# Patient Record
Sex: Female | Born: 1960 | Race: White | Hispanic: No | Smoking: Current every day smoker
Health system: Southern US, Community
[De-identification: ages and names within clinical notes are randomized; demographics above are authoritative.]

## PROBLEM LIST (undated history)

## (undated) DIAGNOSIS — B182 Chronic viral hepatitis C: Secondary | ICD-10-CM

## (undated) DIAGNOSIS — C801 Malignant (primary) neoplasm, unspecified: Secondary | ICD-10-CM

## (undated) DIAGNOSIS — C220 Liver cell carcinoma: Principal | ICD-10-CM

## (undated) DIAGNOSIS — C50919 Malignant neoplasm of unspecified site of unspecified female breast: Secondary | ICD-10-CM

## (undated) HISTORY — DX: Chronic viral hepatitis C: B18.2

## (undated) HISTORY — PX: APPENDECTOMY: SHX54

## (undated) HISTORY — DX: Liver cell carcinoma: C22.0

## (undated) HISTORY — DX: Malignant (primary) neoplasm, unspecified: C80.1

---

## 1998-12-07 ENCOUNTER — Other Ambulatory Visit: Admission: RE | Admit: 1998-12-07 | Discharge: 1998-12-07 | Payer: Self-pay | Admitting: Obstetrics and Gynecology

## 1999-12-16 ENCOUNTER — Other Ambulatory Visit: Admission: RE | Admit: 1999-12-16 | Discharge: 1999-12-16 | Payer: Self-pay | Admitting: Obstetrics and Gynecology

## 2000-04-03 ENCOUNTER — Other Ambulatory Visit: Admission: RE | Admit: 2000-04-03 | Discharge: 2000-04-03 | Payer: Self-pay | Admitting: Obstetrics and Gynecology

## 2000-05-09 ENCOUNTER — Other Ambulatory Visit: Admission: RE | Admit: 2000-05-09 | Discharge: 2000-05-09 | Payer: Self-pay | Admitting: Obstetrics and Gynecology

## 2000-10-04 ENCOUNTER — Other Ambulatory Visit: Admission: RE | Admit: 2000-10-04 | Discharge: 2000-10-04 | Payer: Self-pay | Admitting: Obstetrics and Gynecology

## 2001-09-26 ENCOUNTER — Ambulatory Visit (HOSPITAL_COMMUNITY): Admission: RE | Admit: 2001-09-26 | Discharge: 2001-09-26 | Payer: Self-pay | Admitting: Obstetrics and Gynecology

## 2002-01-17 ENCOUNTER — Ambulatory Visit (HOSPITAL_COMMUNITY): Admission: RE | Admit: 2002-01-17 | Discharge: 2002-01-17 | Payer: Self-pay | Admitting: Obstetrics and Gynecology

## 2002-04-08 ENCOUNTER — Ambulatory Visit (HOSPITAL_COMMUNITY): Admission: RE | Admit: 2002-04-08 | Discharge: 2002-04-08 | Payer: Self-pay | Admitting: Family Medicine

## 2002-04-08 ENCOUNTER — Encounter: Payer: Self-pay | Admitting: Family Medicine

## 2009-09-12 DIAGNOSIS — C50919 Malignant neoplasm of unspecified site of unspecified female breast: Secondary | ICD-10-CM

## 2009-09-12 HISTORY — DX: Malignant neoplasm of unspecified site of unspecified female breast: C50.919

## 2010-03-12 HISTORY — PX: PORT-A-CATH REMOVAL: SHX5289

## 2010-07-26 HISTORY — PX: MASTECTOMY, RADICAL: SHX710

## 2010-07-26 HISTORY — PX: OPEN PARTIAL HEPATECTOMY [83]: SHX5987

## 2011-10-14 HISTORY — PX: PORT-A-CATH REMOVAL: SHX5289

## 2012-08-14 ENCOUNTER — Ambulatory Visit (INDEPENDENT_AMBULATORY_CARE_PROVIDER_SITE_OTHER): Payer: Medicare Other | Admitting: Family Medicine

## 2012-08-14 VITALS — BP 141/87 | HR 102 | Temp 98.0°F | Resp 16 | Ht 63.0 in | Wt 109.2 lb

## 2012-08-14 DIAGNOSIS — C50919 Malignant neoplasm of unspecified site of unspecified female breast: Secondary | ICD-10-CM | POA: Insufficient documentation

## 2012-08-14 DIAGNOSIS — F419 Anxiety disorder, unspecified: Secondary | ICD-10-CM

## 2012-08-14 DIAGNOSIS — M25511 Pain in right shoulder: Secondary | ICD-10-CM

## 2012-08-14 DIAGNOSIS — K7460 Unspecified cirrhosis of liver: Secondary | ICD-10-CM

## 2012-08-14 DIAGNOSIS — G8929 Other chronic pain: Secondary | ICD-10-CM

## 2012-08-14 DIAGNOSIS — F411 Generalized anxiety disorder: Secondary | ICD-10-CM

## 2012-08-14 MED ORDER — ALPRAZOLAM 0.25 MG PO TABS: 0.2500 mg | ORAL_TABLET | Freq: Two times a day (BID) | ORAL | Status: DC | PRN

## 2012-08-14 MED ORDER — OXYCODONE HCL 5 MG PO TABS: 5.0000 mg | ORAL_TABLET | Freq: Three times a day (TID) | ORAL | Status: DC | PRN

## 2012-08-14 NOTE — Progress Notes (Signed)
51 yo woman with h/o cancer who comes in for medication refill.  She lives in [place], but is from [place].  H/O mastectomy Jul 26, 2009 followed by chemo for 1 year. S/P appendectomy Aug, 2012 followed by intensive care because of life support (ruptured appendix) She still has high cancer markers.  She recently had portacath removed.  Patient also has cirrhosis.  Objective:  NAD, seen with twin sister.  Slurred speech  I discussed the dilaudid which I cannot prescribe  Assessment:  Chronically medicated  Plan:

## 2012-11-30 ENCOUNTER — Encounter (HOSPITAL_COMMUNITY): Payer: Self-pay | Admitting: Emergency Medicine

## 2012-11-30 ENCOUNTER — Emergency Department (HOSPITAL_COMMUNITY): Payer: Medicare Other

## 2012-11-30 ENCOUNTER — Emergency Department (HOSPITAL_COMMUNITY)
Admission: EM | Admit: 2012-11-30 | Discharge: 2012-11-30 | Disposition: A | Discharge status: Home / Self Care | Payer: Medicare Other | Attending: Emergency Medicine | Admitting: Emergency Medicine

## 2012-11-30 DIAGNOSIS — R059 Cough, unspecified: Secondary | ICD-10-CM | POA: Insufficient documentation

## 2012-11-30 DIAGNOSIS — F17200 Nicotine dependence, unspecified, uncomplicated: Secondary | ICD-10-CM | POA: Insufficient documentation

## 2012-11-30 DIAGNOSIS — M25559 Pain in unspecified hip: Secondary | ICD-10-CM | POA: Insufficient documentation

## 2012-11-30 DIAGNOSIS — R209 Unspecified disturbances of skin sensation: Secondary | ICD-10-CM | POA: Insufficient documentation

## 2012-11-30 DIAGNOSIS — R05 Cough: Secondary | ICD-10-CM | POA: Insufficient documentation

## 2012-11-30 DIAGNOSIS — Z859 Personal history of malignant neoplasm, unspecified: Secondary | ICD-10-CM | POA: Insufficient documentation

## 2012-11-30 DIAGNOSIS — M25552 Pain in left hip: Secondary | ICD-10-CM

## 2012-11-30 DIAGNOSIS — R110 Nausea: Secondary | ICD-10-CM | POA: Insufficient documentation

## 2012-11-30 DIAGNOSIS — J3489 Other specified disorders of nose and nasal sinuses: Secondary | ICD-10-CM | POA: Insufficient documentation

## 2012-11-30 MED ORDER — OXYCODONE-ACETAMINOPHEN 5-325 MG PO TABS
1.0000 | ORAL_TABLET | Freq: Once | ORAL | Status: DC
  Filled 2012-11-30: qty 1

## 2012-11-30 MED ORDER — OXYCODONE HCL 5 MG PO TABS: 5.0000 mg | ORAL_TABLET | ORAL | Status: DC | PRN

## 2012-11-30 MED ORDER — HYDROMORPHONE HCL PF 1 MG/ML IJ SOLN
1.0000 mg | Freq: Once | INTRAMUSCULAR | Status: AC
  Administered 2012-11-30: 1 mg via INTRAMUSCULAR
  Filled 2012-11-30: qty 1

## 2012-11-30 NOTE — ED Provider Notes (Signed)
Medical screening examination/treatment/procedure(s) were performed by non-physician practitioner and as supervising physician I was immediately available for consultation/collaboration.  Jamie Diaz. Oletta Lamas, MD 11/30/12 [phone]

## 2012-11-30 NOTE — ED Notes (Signed)
Patient transported to X-ray 

## 2012-11-30 NOTE — ED Provider Notes (Signed)
History    This chart was scribed for non-physician practitioner working with [name]. Oletta Lamas, MD by Leone Payor, ED Scribe. This patient was seen in room WTR7/WTR7 and the patient's care was started at 1721.   CSN: [id number]  Arrival date & time 11/30/12  1721   First MD Initiated Contact with Patient 11/30/12 1820      Chief Complaint  Patient presents with  . Leg Pain     The history is provided by the patient. No language interpreter was used.    Jamie Diaz is a 52 y.o. female who presents to the Emergency Department complaining of new, sudden, gradually worsening, left leg pain that begins to left hip area that radiates to left front thigh and lower leg. Pain is intermittent and has been ongoing for 3 days. Pt states the pain is intolerable. She has used pillows to alleviate the pain with no relief. She has associated mild numbness and tingling to the second and third toes. She denies fevers, back pain, loss of control of bowel or bladder, weakness of the leg.  Denies injury.    Pt had flu like symptoms starting at the beginning of the week that have now resolved. She had sore throat, runny nose, cough and congestion.    Pt is a current everyday smoker but denies alcohol use.  Past Medical History  Diagnosis Date  . Cancer     Past Surgical History  Procedure Laterality Date  . Appendectomy    . Brain surgery      Family History  Problem Relation Age of Onset  . Cancer Mother   . Heart disease Father     History  Substance Use Topics  . Smoking status: Current Every Day Smoker  . Smokeless tobacco: Not on file  . Alcohol Use: Not on file    OB History   Grav Para Term Preterm Abortions TAB SAB Ect Mult Living                  Review of Systems  Constitutional: Negative for fever and chills.  HENT: Positive for congestion.   Respiratory: Positive for cough. Negative for shortness of breath.   Cardiovascular: Negative for chest pain.  Gastrointestinal:  Positive for nausea. Negative for abdominal pain.  Genitourinary: Negative for dysuria, urgency, frequency and hematuria.  Musculoskeletal: Negative for back pain.  Neurological: Positive for numbness.    Allergies  Ibuprofen  Home Medications  No current outpatient prescriptions on file.  BP 138/75  Pulse 91  Temp(Src) 97.7 F (36.5 C) (Oral)  SpO2 99%  Physical Exam  Nursing note and vitals reviewed. Constitutional: She appears well-developed and well-nourished. No distress.  HENT:  Head: Normocephalic and atraumatic.  Neck: Neck supple.  Pulmonary/Chest: Effort normal.  Abdominal: Soft. She exhibits no mass. There is no tenderness. There is no rebound and no guarding.  Musculoskeletal: She exhibits no edema.       Left hip: She exhibits normal strength, no swelling, no crepitus and no deformity.       Legs: Spine is non tender, no crepitus, no step offs.   Left hip:  Pain with passive ROM.  Tender posteriorly.  No erythema, edema, warmth.  Distal pulses intact, sensation intact, strength 5/5.    Neurological: She is alert.  Skin: She is not diaphoretic.    ED Course  Procedures (including critical care time)  DIAGNOSTIC STUDIES: Oxygen Saturation is 99% on room air, normal by my interpretation.  COORDINATION OF CARE: 6:31 PM Discussed treatment plan with pt at bedside and pt agreed to plan.    Labs Reviewed - No data to display Dg Hip Complete Left  11/30/2012  *RADIOLOGY REPORT*  Clinical Data: Left hip pain.  LEFT HIP - COMPLETE 2+ VIEW  Comparison: None  Findings: There is no evidence of fracture, subluxation or dislocation. No focal bony lesions are present. Minimal joint space narrowing bilaterally identified. No other significant abnormalities are present.  IMPRESSION: Minimal joint space narrowing without other significant abnormality.   Original Report Authenticated By: Harmon Pier, M.D.    Discussed patient with Dr Oletta Lamas.   1. Left hip pain     MDM   Pt with left hip pain radiating down leg.  No fevers, back pain. Neurovascularly intact.  Hip without erythema, edema, warmth.  No effusion.  Xray shows joint space narrowing.  Pt has full AROM of hip, though with some pain with abduction.  Doubt septic joint.   Pt d/c home with pain medication, PCP follow up.  Discussed all results with patient.  Pt given return precautions.  Pt verbalizes understanding and agrees with plan.        I personally performed the services described in this documentation, which was scribed in my presence. The recorded information has been reviewed and is accurate.   Trixie Dredge, PA-C 11/30/12 2320

## 2012-11-30 NOTE — ED Notes (Signed)
Lt leg pain that begins to hip area that radates to lt thigh to lower leg intermit for 3 days. She states that she felt like she had the flu and thought it was the flu but it is not.

## 2013-02-22 ENCOUNTER — Telehealth: Payer: Self-pay | Admitting: *Deleted

## 2013-02-22 NOTE — Telephone Encounter (Signed)
Had paperwork for this pt in my door when I came in and I saw that she was from [place] of [place] and hx of breast cancer.  Called pt to discuss getting her set up for f/u care w/ a med onc and she said that she has been recommended to see Dr. Cyndie Chime.  I informed her that I do not schedule for him, however, I would take her paperwork to the department that does and either Tiffany or one of her staff members would be back with her to get this appt scheduled.  She was fine w/ this.  Took paperwork to [employer].

## 2013-02-27 NOTE — Telephone Encounter (Signed)
Patient calling in to make sure all records have been received to establish a new patient appt. She has breast cancer and rec'd chemo in [place] where she has been living the past couple years. Currently all treatment is completed. Would like to see Dr Cyndie Chime, due to patient has had several family members under his care. Will forward this information to Tiffany in New Patient Appts.

## 2013-03-13 ENCOUNTER — Telehealth: Payer: Self-pay | Admitting: Oncology

## 2013-03-13 NOTE — Telephone Encounter (Signed)
LEFT PT VM IN REF TO NP APPT ON 04/10/13@1 :30 (PER DR G) REFERRING DR Lenor Derrick [place] MAILED NP PACKET

## 2013-03-13 NOTE — Telephone Encounter (Signed)
C/D 03/13/13 for appt. 04/10/13

## 2013-03-31 ENCOUNTER — Other Ambulatory Visit: Payer: Self-pay | Admitting: Oncology

## 2013-03-31 DIAGNOSIS — C50912 Malignant neoplasm of unspecified site of left female breast: Secondary | ICD-10-CM

## 2013-04-10 ENCOUNTER — Ambulatory Visit: Payer: Medicare Other

## 2013-04-10 ENCOUNTER — Encounter: Payer: Self-pay | Admitting: Oncology

## 2013-04-10 ENCOUNTER — Ambulatory Visit: Payer: Medicare Other | Admitting: Lab

## 2013-04-10 ENCOUNTER — Ambulatory Visit (HOSPITAL_BASED_OUTPATIENT_CLINIC_OR_DEPARTMENT_OTHER): Payer: Medicare Other | Admitting: Oncology

## 2013-04-10 ENCOUNTER — Other Ambulatory Visit (HOSPITAL_BASED_OUTPATIENT_CLINIC_OR_DEPARTMENT_OTHER): Payer: Medicare Other | Admitting: Lab

## 2013-04-10 VITALS — BP 190/105 | HR 114 | Temp 97.8°F | Resp 20 | Ht 64.0 in | Wt 123.1 lb

## 2013-04-10 DIAGNOSIS — C50912 Malignant neoplasm of unspecified site of left female breast: Secondary | ICD-10-CM

## 2013-04-10 DIAGNOSIS — C50919 Malignant neoplasm of unspecified site of unspecified female breast: Secondary | ICD-10-CM

## 2013-04-10 DIAGNOSIS — K7460 Unspecified cirrhosis of liver: Secondary | ICD-10-CM

## 2013-04-10 DIAGNOSIS — B182 Chronic viral hepatitis C: Secondary | ICD-10-CM

## 2013-04-10 DIAGNOSIS — C220 Liver cell carcinoma: Secondary | ICD-10-CM

## 2013-04-10 DIAGNOSIS — C228 Malignant neoplasm of liver, primary, unspecified as to type: Secondary | ICD-10-CM

## 2013-04-10 HISTORY — DX: Chronic viral hepatitis C: B18.2

## 2013-04-10 LAB — CBC WITH DIFFERENTIAL/PLATELET
BASO%: 0.4 % (ref 0.0–2.0)
Basophils Absolute: 0 10*3/uL (ref 0.0–0.1)
HCT: 42.6 % (ref 34.8–46.6)
HGB: 14.7 g/dL (ref 11.6–15.9)
MCHC: 34.5 g/dL (ref 31.5–36.0)
MONO#: 0.3 10*3/uL (ref 0.1–0.9)
NEUT%: 72.3 % (ref 38.4–76.8)
RDW: 14.4 % (ref 11.2–14.5)
WBC: 4.5 10*3/uL (ref 3.9–10.3)
lymph#: 0.8 10*3/uL — ABNORMAL LOW (ref 0.9–3.3)

## 2013-04-10 LAB — COMPREHENSIVE METABOLIC PANEL (CC13)
ALT: 83 U/L — ABNORMAL HIGH (ref 0–55)
AST: 159 U/L — ABNORMAL HIGH (ref 5–34)
Albumin: 3.4 g/dL — ABNORMAL LOW (ref 3.5–5.0)
CO2: 25 mEq/L (ref 22–29)
Calcium: 9.3 mg/dL (ref 8.4–10.4)
Chloride: 102 mEq/L (ref 98–109)
Creatinine: 0.8 mg/dL (ref 0.6–1.1)
Potassium: 4.1 mEq/L (ref 3.5–5.1)
Total Protein: 9.2 g/dL — ABNORMAL HIGH (ref 6.4–8.3)

## 2013-04-10 LAB — URIC ACID (CC13): Uric Acid, Serum: 6.4 mg/dl (ref 2.6–7.4)

## 2013-04-10 LAB — CANCER ANTIGEN 27.29: CA 27.29: 15 U/mL (ref 0–39)

## 2013-04-10 NOTE — Progress Notes (Signed)
New Patient Hematology-Oncology Evaluation   Jamie Diaz [id number] 08/16/1961 52 y.o. 04/10/2013  CC: Dr. Collins Scotland   Reason for referral: Assume care for this lady relocating here from [place] with history of breast cancer and hepatocellular carcinoma.    HPI:  New patient evaluation  for this pleasant 52 year old woman who presented with a large mass in her left breast. Initial biopsy done 01/18/2010 discovered when she was in [place] where her son-in-law was stationed in the [employer]. She went to assist her daughter who had just had an ectopic pregnancy. 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 During her staging evaluation she was found to have a 2.8 cm lesion in the left lobe of her liver. Liver appeared cirrhotic on CT scan with evidence of portal hypertension and splenomegaly. She had a history of heavy alcohol use. The liver mass was initially felt to be a cirrhotic nodule. However she was found to have an elevated alpha-fetoprotein and further imaging show that the mass was hypervascular and more consistent with a primary hepatocellular carcinoma.. Testing around that time revealed that she was hepatitis C positive.  She underwent initial neoadjuvant chemotherapy with 3 cycles of Adriamycin Cytoxan given through 04/21/2010. On July 26, 2010 she underwent a left modified radical mastectomy with a concomitant partial hepatectomy. The liver lesion was confirmed to be a second primary hepatocellular carcinoma. Stage I.  grade 4, size 2.6 x 2 x 1 cm. She received additional adjuvant chemotherapy after surgery with Taxol at a dose of 175 mg per meter squared every 2 weeks for 4 treatments as well as Herceptin 8 mg per kilogram loading dose then 6 mg per kilogram every 3 weeks. Herceptin was continued for one year through November 2012. She had a brief scare when a new mass was found in  her right breast. Further evaluation revealed that this was a retained part of a Port-A-Cath device which was then surgically removed.  In August of 2013 she had a ruptured appendix complicated by respiratory failure requiring transfer to a major hospital in [place] and temporary ventilator support.   PMH: Past Medical History  Diagnosis Date  . Cancer   . Hepatitis C, chronic 04/10/2013    Dx 2011  No history of MI,  hypertension, diabetes,, tuberculosis, kidney stones, thyroid trouble, seizure, stroke, blood clots. Positive history of ulcers,. Dysplastic changes of the uterus on Pap smear?. Chronic mild thrombocytopenia. Insomnia. Panic attacks. Depression. Chronic right shoulder pain.  Past Surgical History  Procedure Laterality Date  . Appendectomy    . Brain surgery?    Endometrial ablation x2 for menorrhagia.  Allergies: Allergies  Allergen Reactions  . 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 Xanax 0.5 mg up to every 4 hours for anxiety and panic attacks.   Social History: Divorced but her ex. husband and one of her sisters accompanies her today. She has one daughter age 79 who is healthy. She worked in the recent past as a Retail banker.  reports that she has been smoking Cigarettes.  She has been smoking about 0.50 packs per day.   She drinks 3-4 beers a day or one bottle of wine daily.. Curiously she is concerned about taking Tylenol since it might damage her liver. No blood transfusions before her history of breast cancer and cirrhosis.  She admits to IV drug use on a number of occasions when she was in college.  Family History: Family History  Problem Relation Age of Onset  . Cancer Mother   . Heart disease Father   Father died of coronary artery disease at age 34. Mother diagnosed with colon cancer at age 54 and died from progression at age 8. Her brother died at  age 32 likely overdosed alcohol and narcotics.  Review of Systems: Constitutional symptoms: Recently increased fatigue HEENT: No sore throat Respiratory: No cough or dyspnea Cardiovascular:  No chest pain or palpitations Gastrointestinal ROS: No abdominal pain or change in bowel habit Genito-Urinary ROS: No vaginal bleeding Hematological and Lymphatic: Musculoskeletal: Chronic right shoulder pain thought to be degenerative. She was not told that she had a rotator cuff tear. She has noted increasing leg cramps recently. Neurologic: No headache or change in vision. No paresthesias. Positive insomnia. Positive panic attacks. Denies any significant headaches. Dermatologic: No rash or ecchymosis Last mammogram done over a year ago. Remaining ROS negative.  Physical Exam: Blood pressure 190/105, pulse 114, temperature 97.8 F (36.6 C), temperature source Oral, resp. rate 20, height 5\' 4"  (1.626 m), weight 123 lb 1.6 oz (55.838 kg). Wt Readings from Last 3 Encounters:  04/10/13 123 lb 1.6 oz (55.838 kg)  08/14/12 109 lb 3.2 oz (49.533 kg)    General appearance: Thin, Caucasian woman HENNT: Pharynx no erythema exudate or mass Lymph nodes: No lymphadenopathy Breasts: Left mastectomy. No chest wall lesions. No right breast masses. Lungs: Clear to auscultation resonant to percussion Heart: Regular rhythm no murmur Vascular: No carotid bruits, and no cyanosis Abdominal: Soft, nontender, she was ticklish and difficult to examine, no gross organomegaly although I suspect splenomegaly and splenomegaly was noted on a previous CT scan during her cancer evaluation. GU: Extremities: No edema, no calf tenderness Neurologic: Mental status intact, PERRLA, optic disc not well visualized, motor strength 5 over 5, reflexes 1+ symmetric, sensation intact to vibration over the fingertips. Cranial nerves grossly normal. Skin: Spider hemangiomas on the bib the area of her chest    Lab Results: Lab Results   Component Value Date   WBC 4.5 04/10/2013   HGB 14.7 04/10/2013   HCT 42.6 04/10/2013   MCV 97.9 04/10/2013   PLT 98* 04/10/2013     Chemistry      Component Value Date/Time   NA 139 04/10/2013 1332   K 4.1 04/10/2013 1332   CO2 25 04/10/2013 1332   BUN 7.8 04/10/2013 1332   CREATININE 0.8 04/10/2013 1332      Component Value Date/Time   CALCIUM 9.3 04/10/2013 1332   ALKPHOS 99 04/10/2013 1332   AST 159* 04/10/2013 1332   ALT 83* 04/10/2013 1332   BILITOT 0.95 04/10/2013 1332       Pathology: See discussion above     Impression and Plan: #1. T2 N0 ER/PR negative node negative HER-2 positive cancer of the left breast treated as outlined above. No evidence for recurrence at this time. I'm going to schedule a routine right mammogram at the breast imaging center.  #2. Stage I, poorly differentiated hepatocellular carcinoma surgically resected November 2011. No check an alpha-fetoprotein. At some point we will need to get followup imaging.  #3. Chronic hepatitis C infection. She has never been treated for this. I will check a viral load and do a genotype study. I will make a referral to gastroenterology for treatment recommendations if appropriate.  #4. Cirrhosis secondary to #3 Upper endoscopy  done 03/09/2010 did not show any esophageal varices.  #5.. Ongoing alcohol and tobacco use  #6. Splenomegaly due to portal hypertension with associated mild thrombocytopenia  #7. Question history abnormal Pap smear  #8. Chronic pain right shoulder She is requesting Dilaudid. She states this is anything that has worked for her. I told her that this was addicting and I would not prescribe it. I gave her a prescription for Ultram 50 mg one to 2 q. 6 hours when necessary #100 one refill.  #9. Chronic anxiety and depression. I gave her a prescription for Xanax 0.5 mg 3 times a day #90 one refill until she get establish with primary care physician. She may benefit from counseling.  Over one  hour reviewing voluminous records from [place]. Over one hour in direct face-to-face contact with patient and family. 40 minutes to do this dictation.      Levert Feinstein, MD 04/10/2013, 6:26 PM

## 2013-04-10 NOTE — Progress Notes (Signed)
Checked in new pt with no financial concerns. °

## 2013-04-15 ENCOUNTER — Other Ambulatory Visit: Payer: Self-pay | Admitting: Oncology

## 2013-04-15 DIAGNOSIS — C220 Liver cell carcinoma: Secondary | ICD-10-CM

## 2013-04-15 LAB — HEPATITIS C RNA QUANTITATIVE: HCV Quantitative Log: 6.46 {Log} — ABNORMAL HIGH (ref <1.18)

## 2013-04-15 LAB — GAMMA GT: GGT: 493 U/L — ABNORMAL HIGH (ref 7–51)

## 2013-04-17 ENCOUNTER — Ambulatory Visit
Admission: RE | Admit: 2013-04-17 | Discharge: 2013-04-17 | Disposition: A | Discharge status: Home / Self Care | Payer: Medicare Other | Source: Ambulatory Visit | Attending: Oncology | Admitting: Oncology

## 2013-04-17 DIAGNOSIS — C220 Liver cell carcinoma: Secondary | ICD-10-CM

## 2013-04-17 DIAGNOSIS — K7460 Unspecified cirrhosis of liver: Secondary | ICD-10-CM

## 2013-04-17 DIAGNOSIS — C50912 Malignant neoplasm of unspecified site of left female breast: Secondary | ICD-10-CM

## 2013-04-17 DIAGNOSIS — B182 Chronic viral hepatitis C: Secondary | ICD-10-CM

## 2013-04-18 ENCOUNTER — Encounter: Payer: Self-pay | Admitting: Oncology

## 2013-04-18 ENCOUNTER — Ambulatory Visit (HOSPITAL_COMMUNITY)
Admission: RE | Admit: 2013-04-18 | Discharge: 2013-04-18 | Disposition: A | Discharge status: Home / Self Care | Payer: Medicare Other | Source: Ambulatory Visit | Attending: Oncology | Admitting: Oncology

## 2013-04-18 ENCOUNTER — Telehealth: Payer: Self-pay | Admitting: *Deleted

## 2013-04-18 ENCOUNTER — Other Ambulatory Visit: Payer: Self-pay | Admitting: Oncology

## 2013-04-18 DIAGNOSIS — D732 Chronic congestive splenomegaly: Secondary | ICD-10-CM | POA: Insufficient documentation

## 2013-04-18 DIAGNOSIS — C220 Liver cell carcinoma: Secondary | ICD-10-CM

## 2013-04-18 DIAGNOSIS — Z8505 Personal history of malignant neoplasm of liver: Secondary | ICD-10-CM | POA: Insufficient documentation

## 2013-04-18 DIAGNOSIS — R978 Other abnormal tumor markers: Secondary | ICD-10-CM | POA: Insufficient documentation

## 2013-04-18 DIAGNOSIS — K7689 Other specified diseases of liver: Secondary | ICD-10-CM | POA: Insufficient documentation

## 2013-04-18 HISTORY — DX: Liver cell carcinoma: C22.0

## 2013-04-18 MED ORDER — GADOBENATE DIMEGLUMINE 529 MG/ML IV SOLN
12.0000 mL | Freq: Once | INTRAVENOUS | Status: AC | PRN
  Administered 2013-04-18: 12 mL via INTRAVENOUS

## 2013-04-18 NOTE — Telephone Encounter (Signed)
sw pt gv appts for 07/09/13 w/labs @ 11am and ov @ 11:30am. Pt is aware that i will mail a letter/avs as well. i sw Amy at [facility] and she stated that they do not except hepatitis C pts. i made JG aware...td

## 2013-04-19 ENCOUNTER — Other Ambulatory Visit: Payer: Self-pay | Admitting: Oncology

## 2013-04-19 DIAGNOSIS — C220 Liver cell carcinoma: Secondary | ICD-10-CM

## 2013-04-19 DIAGNOSIS — C50912 Malignant neoplasm of unspecified site of left female breast: Secondary | ICD-10-CM

## 2013-04-23 ENCOUNTER — Ambulatory Visit (HOSPITAL_COMMUNITY)
Admission: RE | Admit: 2013-04-23 | Discharge: 2013-04-23 | Disposition: A | Discharge status: Home / Self Care | Payer: Medicare Other | Source: Ambulatory Visit | Attending: Oncology | Admitting: Oncology

## 2013-04-23 DIAGNOSIS — I2510 Atherosclerotic heart disease of native coronary artery without angina pectoris: Secondary | ICD-10-CM | POA: Insufficient documentation

## 2013-04-23 DIAGNOSIS — K7460 Unspecified cirrhosis of liver: Secondary | ICD-10-CM | POA: Insufficient documentation

## 2013-04-23 DIAGNOSIS — Z9010 Acquired absence of unspecified breast and nipple: Secondary | ICD-10-CM | POA: Insufficient documentation

## 2013-04-23 DIAGNOSIS — R978 Other abnormal tumor markers: Secondary | ICD-10-CM | POA: Insufficient documentation

## 2013-04-23 DIAGNOSIS — C50912 Malignant neoplasm of unspecified site of left female breast: Secondary | ICD-10-CM

## 2013-04-23 DIAGNOSIS — C50919 Malignant neoplasm of unspecified site of unspecified female breast: Secondary | ICD-10-CM | POA: Insufficient documentation

## 2013-04-23 DIAGNOSIS — R599 Enlarged lymph nodes, unspecified: Secondary | ICD-10-CM | POA: Insufficient documentation

## 2013-04-23 DIAGNOSIS — I700 Atherosclerosis of aorta: Secondary | ICD-10-CM | POA: Insufficient documentation

## 2013-04-23 DIAGNOSIS — R161 Splenomegaly, not elsewhere classified: Secondary | ICD-10-CM | POA: Insufficient documentation

## 2013-04-23 DIAGNOSIS — C220 Liver cell carcinoma: Secondary | ICD-10-CM

## 2013-04-23 DIAGNOSIS — C228 Malignant neoplasm of liver, primary, unspecified as to type: Secondary | ICD-10-CM | POA: Insufficient documentation

## 2013-04-23 MED ORDER — IOHEXOL 300 MG/ML  SOLN
100.0000 mL | Freq: Once | INTRAMUSCULAR | Status: AC | PRN
  Administered 2013-04-23: 100 mL via INTRAVENOUS

## 2013-04-25 ENCOUNTER — Telehealth: Payer: Self-pay | Admitting: *Deleted

## 2013-04-25 ENCOUNTER — Other Ambulatory Visit: Payer: Self-pay | Admitting: Oncology

## 2013-04-25 NOTE — Telephone Encounter (Signed)
Patient is calling for results of mammogram and CT scans and blood work.  Please call her on her cell phone [phone].

## 2013-04-26 ENCOUNTER — Other Ambulatory Visit: Payer: Self-pay | Admitting: Oncology

## 2013-04-26 ENCOUNTER — Telehealth: Payer: Self-pay | Admitting: Oncology

## 2013-04-26 ENCOUNTER — Telehealth: Payer: Self-pay | Admitting: *Deleted

## 2013-04-26 NOTE — Telephone Encounter (Signed)
lvm for pt regarding to 8.19.14 appt.Marland KitchenMarland KitchenMarland Kitchen

## 2013-04-26 NOTE — Telephone Encounter (Signed)
Received vm call from pt req return call about appt 8/19.  She reports that she has a beach trip planned & won't be back until thurs of next week & wants to know if this is urgent.  Returned call after discussing with Dr Cyndie Chime & informed pt that appt is not urgent & we will try to r/s as soon as possible. She asked that we call her land line 1st due to poor reception on her mobile in her apartment.

## 2013-04-30 ENCOUNTER — Ambulatory Visit: Payer: Medicare Other | Admitting: Oncology

## 2013-05-01 ENCOUNTER — Encounter: Payer: Self-pay | Admitting: Oncology

## 2013-05-01 NOTE — Progress Notes (Signed)
He 52 year old woman with breast cancer and primary liver cancer new to our practice. Restaging evaluation showed marked elevation of alpha-fetoprotein tumor marker. MRI of the liver and subsequent CT scan chest abdomen and pelvis shows malignant appearing adenopathy in the porta hepatis. I reported these findings to the patient by phone. I arranged for an appointment today to discuss further treatment. Patient informed us that she was going to the beach with her family and will reschedule the appointment.

## 2013-05-08 ENCOUNTER — Telehealth: Payer: Self-pay | Admitting: Oncology

## 2013-05-08 NOTE — Telephone Encounter (Signed)
Called pt again left another message, called pt yesterday x2

## 2013-05-09 ENCOUNTER — Telehealth: Payer: Self-pay | Admitting: Oncology

## 2013-05-09 NOTE — Telephone Encounter (Signed)
talked to pt today, she wants appt with MD r/s [facility] 8/18, emailed Dr. Cyndie Chime, waiting for reply

## 2013-05-14 ENCOUNTER — Telehealth: Payer: Self-pay | Admitting: Oncology

## 2013-05-14 NOTE — Telephone Encounter (Signed)
Talked to pt and gave her appt for September per Dr. Reece Agar

## 2013-05-24 ENCOUNTER — Other Ambulatory Visit: Payer: Self-pay

## 2013-05-24 DIAGNOSIS — C50919 Malignant neoplasm of unspecified site of unspecified female breast: Secondary | ICD-10-CM

## 2013-05-24 MED ORDER — ALPRAZOLAM 0.5 MG PO TABS: 0.5000 mg | ORAL_TABLET | Freq: Three times a day (TID) | ORAL | Status: DC | PRN

## 2013-05-24 MED ORDER — MAGNESIUM OXIDE 400 (241.3 MG) MG PO TABS: 400.0000 mg | ORAL_TABLET | Freq: Two times a day (BID) | ORAL | Status: DC

## 2013-05-24 MED ORDER — TRAMADOL HCL 50 MG PO TABS: ORAL_TABLET | ORAL | Status: DC

## 2013-05-27 ENCOUNTER — Ambulatory Visit (HOSPITAL_BASED_OUTPATIENT_CLINIC_OR_DEPARTMENT_OTHER): Payer: Medicare Other | Admitting: Oncology

## 2013-05-27 ENCOUNTER — Other Ambulatory Visit: Payer: Self-pay | Admitting: *Deleted

## 2013-05-27 ENCOUNTER — Other Ambulatory Visit: Payer: Medicare Other | Admitting: Lab

## 2013-05-27 VITALS — BP 159/94 | HR 91 | Temp 97.2°F | Resp 20 | Ht 64.0 in | Wt 120.6 lb

## 2013-05-27 DIAGNOSIS — C50919 Malignant neoplasm of unspecified site of unspecified female breast: Secondary | ICD-10-CM

## 2013-05-27 DIAGNOSIS — C220 Liver cell carcinoma: Secondary | ICD-10-CM

## 2013-05-27 DIAGNOSIS — C50912 Malignant neoplasm of unspecified site of left female breast: Secondary | ICD-10-CM

## 2013-05-27 DIAGNOSIS — B182 Chronic viral hepatitis C: Secondary | ICD-10-CM

## 2013-05-27 DIAGNOSIS — F329 Major depressive disorder, single episode, unspecified: Secondary | ICD-10-CM

## 2013-05-27 DIAGNOSIS — C228 Malignant neoplasm of liver, primary, unspecified as to type: Secondary | ICD-10-CM

## 2013-05-27 MED ORDER — CITALOPRAM HYDROBROMIDE 20 MG PO TABS: 20.0000 mg | ORAL_TABLET | Freq: Every day | ORAL | Status: DC

## 2013-05-27 NOTE — Progress Notes (Signed)
Hematology and Oncology Follow Up Visit  Jamie Diaz [id number] [birth year] 52 y.o. 05/27/2013 7:29 PM   Principle Diagnosis: Encounter Diagnoses  Name Primary?  . Hepatocellular carcinoma Yes  . Hepatitis C, chronic   . Breast cancer, left      Interim History:   Short interval followup visit for this 52 year old woman found to have synchronous primary invasive cancers of her left breast and primary hepatocellular carcinoma in the context of chronic hepatitis C infection. Please see my initial office consultation 04/10/2013 for complete details. Following neoadjuvant chemotherapy, she underwent modified left radical mastectomy with concomitant partial hepatectomy on July 26, 2010. Breast cancer was stage TII N0, ER/PR negative, HER-2 positive. Liver cancer staged 1 but poorly differentiated hepatocellular carcinoma. I found no record of alpha-fetoprotein in the  records that the patient brought with her when she moved back here from [place] recently.  As part of my initial evaluation, I obtained alpha-fetoprotein tumor marker on 04/10/2013 which unexpectedly returned markedly elevated at 2465.5 units. A breast cancer tumor marker was normal (CA 2729). I obtained an MRI of the liver on August 7 which showed an enhancing 2.9 x 4.4 cm lymph node in the porta hepatis with an additional 2 x 2.4 cm peripancreatic lymph node. She has a nodular liver consistent with cirrhosis with a questionable 12 mm nodule in the medial segment, left hepatic lobe and a questionable 6 mm subcapsular nodule anterior segment right hepatic lobe. A follow this up with a CT scan of the chest abdomen and pelvis was done to look for additional sites of disease since there appeared to be a discrepancy between the significant elevation of alpha-fetoprotein and the findings in the abdomen. The CT scan appeared to be less sensitive than the MRI with respect to abnormalities in the liver but did demonstrate the 2 areas of nodal  disease mentioned on the MRI. No obvious disease in the chest. Also noted were cirrhotic changes in the liver and mild splenomegaly 13.5 cm. I reviewed all of these images with the patient and her female friend.  She remains asymptomatic at present except for marked anxiety and depression with crying spells and asked that I prescribe an antidepressant until we can arrange for her to get a primary care physician.      Medications: reviewed  Allergies:  Allergies  Allergen Reactions  . Ibuprofen Hives and Palpitations    Review of Systems: Constitutional:   No constitutional symptoms HEENT no sore throat Respiratory: No cough or dyspnea Cardiovascular:  No chest pain or palpitations Gastrointestinal: No abdominal pain or change in bowel habit Genito-Urinary: Not questioned Musculoskeletal: No muscle bone or joint pain except for pain in her great toe with previous history of being stepped on by a horse. Neurologic: No headache or change in vision Skin: No rash or ecchymosis  Remaining ROS negative.     Physical Exam: Blood pressure 159/94, pulse 91, temperature 97.2 F (36.2 C), temperature source Oral, resp. rate 20, height 5\' 4"  (1.626 m), weight 120 lb 9.6 oz (54.704 kg). Wt Readings from Last 3 Encounters:  05/27/13 120 lb 9.6 oz (54.704 kg)  04/10/13 123 lb 1.6 oz (55.838 kg)  08/14/12 109 lb 3.2 oz (49.533 kg)     General appearance: Thin Caucasian woman HENNT: Pharynx no erythema or exudate Lymph nodes: No adenopathy in the neck, supraclavicular, or cervical regions Breasts: Previous left mastectomy, no right breast masses Lungs: Clear to auscultation resonant to percussion Heart: Regular rhythm no murmur  Abdomen: Soft, nontender, liver enlarged about 5 cm below right costal margin Extremities: No edema, no calf tenderness Musculoskeletal: No joint deformities GU: Vascular: No carotid bruits, no cyanosis Neurologic: Mental status intact, cranial nerves grossly  normal, motor strength 5 over 5, reflexes 2+ symmetric Skin: No rash or ecchymosis  Lab Results: Lab Results  Component Value Date   WBC 4.5 04/10/2013   HGB 14.7 04/10/2013   HCT 42.6 04/10/2013   MCV 97.9 04/10/2013   PLT 98* 04/10/2013     Chemistry      Component Value Date/Time   NA 139 04/10/2013 1332   K 4.1 04/10/2013 1332   CO2 25 04/10/2013 1332   BUN 7.8 04/10/2013 1332   CREATININE 0.8 04/10/2013 1332      Component Value Date/Time   CALCIUM 9.3 04/10/2013 1332   ALKPHOS 99 04/10/2013 1332   AST 159* 04/10/2013 1332   ALT 83* 04/10/2013 1332   BILITOT 0.95 04/10/2013 1332       Radiological Studies: See discussion above   Impression: #1. Relapsed hepatocellular carcinoma She asked about surgical options. She has 2 areas of localized nodal disease. Although surgery would not be curative, it might be reasonable and provide good palliation and prevent biliary tract obstruction given the porta hepatis location of the dominant lymph node mass. She might also be a candidate for radiofrequency ablation.  Whether or not she has a local procedure, I would still put her on a trial of sorafenib multi-kinase inhibitor. We discussed this at length today along with her female friend and I gave her written information about  the sorafenib. I will ask our oncologic surgeon in opinion on the feasibility of surgical resection of the malignant lymph nodes. I will ask our interventional radiologist the feasibility of radiofrequency ablation. I will schedule a short interval followup and initiate the sorafenib once a decision is made on whether or not she is a suitable candidate for a local procedure.  #2. Stage II ER/PR negative HER-2 positive cancer of the left breast currently in remission  #3. Hepatitis C  #4. Alcohol related cirrhosis of the liver  #5. Anxiety and depression. She is already on Xanax. I'm going to Celexa 20 mg daily  Over 40 minutes spent with the patient with known 50%  with face-to-face counseling      CC:. Dr.Faera  Illene Regulus, MD 9/15/20147:29 PM

## 2013-05-29 ENCOUNTER — Telehealth: Payer: Self-pay | Admitting: Oncology

## 2013-05-29 NOTE — Telephone Encounter (Signed)
, °

## 2013-06-06 ENCOUNTER — Telehealth: Payer: Self-pay | Admitting: Oncology

## 2013-06-06 ENCOUNTER — Encounter: Payer: Self-pay | Admitting: Oncology

## 2013-06-06 ENCOUNTER — Other Ambulatory Visit: Payer: Self-pay | Admitting: *Deleted

## 2013-06-06 ENCOUNTER — Encounter: Payer: Self-pay | Admitting: *Deleted

## 2013-06-06 ENCOUNTER — Other Ambulatory Visit: Payer: Self-pay | Admitting: Oncology

## 2013-06-06 NOTE — Progress Notes (Signed)
RECEIVED A FAX FROM [facility] CONCERNING A PRIOR AUTHORIZATION FOR NEXAVAR. THIS REQUEST WAS PLACED IN THE MANAGED CARE BIN.

## 2013-06-06 NOTE — Telephone Encounter (Signed)
per Rema Jasmine information sent to pharmacy for preauth for sorafenib

## 2013-06-06 NOTE — Progress Notes (Signed)
Script for sorafenib (nexavar) given to [facility] for pre-authoriztion

## 2013-06-06 NOTE — Progress Notes (Signed)
[facility], [phone], for nexavar 200mg  pa; should receive response within 24 hours case # [id number].

## 2013-06-06 NOTE — Progress Notes (Signed)
Faxed sorafenib prescription to [facility].

## 2013-06-07 ENCOUNTER — Encounter: Payer: Self-pay | Admitting: Oncology

## 2013-06-07 NOTE — Progress Notes (Signed)
[facility], [phone], approved nexavar from 06/06/13-06/06/14.

## 2013-06-10 ENCOUNTER — Telehealth: Payer: Self-pay | Admitting: *Deleted

## 2013-06-10 NOTE — Telephone Encounter (Addendum)
Received vm call from pt stating she received a call from [facility] stating script is ready & she was a little floored by this & wants to know if she is to start the nexavar now before surg or radiation.  Will discuss with Dr Cyndie Chime. Per Dr. Cyndie Chime, pt not a candidate for RFA & he will make referral to [facility].  Asked if pt had seen surgeon yet.  She has not heard from anyone.  When call returned she had already started the nexavar at lunch today.  After discussing with Dr Cyndie Chime, message left on pt identified vm to cont nexavar & I will call Dr Arita Miss office tomorrow re: referral.

## 2013-06-14 ENCOUNTER — Telehealth: Payer: Self-pay | Admitting: *Deleted

## 2013-06-14 DIAGNOSIS — R110 Nausea: Secondary | ICD-10-CM

## 2013-06-14 MED ORDER — ONDANSETRON 4 MG PO TBDP: ORAL_TABLET | ORAL | Status: AC

## 2013-06-14 NOTE — Telephone Encounter (Signed)
Received vm call from pt & call returned & pt reports that she is on her 4 th full day of the nexavar & has read about some of the side effects & has some but reports as uncomfortable but not anything that she can't handle.  She reports diarrhea (more than 4 x's day), loss of appetite, dull h/a, facial redness, & nausea with dry heaves-no vomiting b/c she states not eating much. Discussed staying hydrated, drinking plenty of fluids, taking imodium over the counter, BRAT diet, & will ask Dr. Cyndie Chime for nausea med.  Instructed to call if symptoms worsen.

## 2013-07-01 ENCOUNTER — Ambulatory Visit (HOSPITAL_BASED_OUTPATIENT_CLINIC_OR_DEPARTMENT_OTHER): Payer: Medicare Other

## 2013-07-01 ENCOUNTER — Other Ambulatory Visit (HOSPITAL_BASED_OUTPATIENT_CLINIC_OR_DEPARTMENT_OTHER): Payer: Medicare Other

## 2013-07-01 ENCOUNTER — Telehealth: Payer: Self-pay | Admitting: *Deleted

## 2013-07-01 ENCOUNTER — Ambulatory Visit (HOSPITAL_BASED_OUTPATIENT_CLINIC_OR_DEPARTMENT_OTHER): Payer: Medicare Other | Admitting: Oncology

## 2013-07-01 VITALS — BP 156/94 | HR 95 | Temp 97.1°F | Resp 19 | Ht 64.0 in | Wt 113.9 lb

## 2013-07-01 DIAGNOSIS — B182 Chronic viral hepatitis C: Secondary | ICD-10-CM

## 2013-07-01 DIAGNOSIS — C220 Liver cell carcinoma: Secondary | ICD-10-CM

## 2013-07-01 DIAGNOSIS — F411 Generalized anxiety disorder: Secondary | ICD-10-CM

## 2013-07-01 DIAGNOSIS — C50919 Malignant neoplasm of unspecified site of unspecified female breast: Secondary | ICD-10-CM

## 2013-07-01 DIAGNOSIS — C228 Malignant neoplasm of liver, primary, unspecified as to type: Secondary | ICD-10-CM

## 2013-07-01 DIAGNOSIS — K7030 Alcoholic cirrhosis of liver without ascites: Secondary | ICD-10-CM

## 2013-07-01 DIAGNOSIS — R197 Diarrhea, unspecified: Secondary | ICD-10-CM

## 2013-07-01 DIAGNOSIS — K7460 Unspecified cirrhosis of liver: Secondary | ICD-10-CM

## 2013-07-01 LAB — CBC WITH DIFFERENTIAL/PLATELET
BASO%: 1.2 % (ref 0.0–2.0)
Basophils Absolute: 0 10*3/uL (ref 0.0–0.1)
Eosinophils Absolute: 0.1 10*3/uL (ref 0.0–0.5)
HCT: 44.6 % (ref 34.8–46.6)
HGB: 15.2 g/dL (ref 11.6–15.9)
LYMPH%: 25.2 % (ref 14.0–49.7)
MCHC: 34.1 g/dL (ref 31.5–36.0)
MCV: 96.4 fL (ref 79.5–101.0)
MONO#: 0.2 10*3/uL (ref 0.1–0.9)
MONO%: 6.9 % (ref 0.0–14.0)
NEUT#: 1.9 10*3/uL (ref 1.5–6.5)
Platelets: 39 10*3/uL — ABNORMAL LOW (ref 145–400)
RBC: 4.63 10*6/uL (ref 3.70–5.45)
WBC: 2.9 10*3/uL — ABNORMAL LOW (ref 3.9–10.3)

## 2013-07-01 LAB — COMPREHENSIVE METABOLIC PANEL (CC13)
ALT: 75 U/L — ABNORMAL HIGH (ref 0–55)
AST: 179 U/L — ABNORMAL HIGH (ref 5–34)
Anion Gap: 11 mEq/L (ref 3–11)
BUN: 7.3 mg/dL (ref 7.0–26.0)
CO2: 25 mEq/L (ref 22–29)
Creatinine: 0.9 mg/dL (ref 0.6–1.1)
Total Bilirubin: 3.23 mg/dL — ABNORMAL HIGH (ref 0.20–1.20)

## 2013-07-01 MED ORDER — POTASSIUM CHLORIDE CRYS ER 20 MEQ PO TBCR: 20.0000 meq | EXTENDED_RELEASE_TABLET | Freq: Two times a day (BID) | ORAL | Status: DC

## 2013-07-01 MED ORDER — ONDANSETRON 8 MG/NS 50 ML IVPB
INTRAVENOUS | Status: AC
  Filled 2013-07-01: qty 8

## 2013-07-01 MED ORDER — POTASSIUM CHLORIDE CRYS ER 20 MEQ PO TBCR
20.0000 meq | EXTENDED_RELEASE_TABLET | Freq: Once | ORAL | Status: AC
  Administered 2013-07-01: 20 meq via ORAL
  Filled 2013-07-01: qty 1

## 2013-07-01 MED ORDER — LORAZEPAM 2 MG/ML IJ SOLN
1.0000 mg | Freq: Once | INTRAMUSCULAR | Status: AC
  Administered 2013-07-01: 1 mg via INTRAVENOUS

## 2013-07-01 MED ORDER — ONDANSETRON 8 MG/50ML IVPB (CHCC)
8.0000 mg | Freq: Once | INTRAVENOUS | Status: AC
  Administered 2013-07-01: 8 mg via INTRAVENOUS

## 2013-07-01 MED ORDER — LORAZEPAM 2 MG/ML IJ SOLN
INTRAMUSCULAR | Status: AC
  Filled 2013-07-01: qty 1

## 2013-07-01 MED ORDER — SODIUM CHLORIDE 0.9 % IV SOLN
INTRAVENOUS | Status: AC
  Administered 2013-07-01: 14:00:00 via INTRAVENOUS

## 2013-07-01 NOTE — Progress Notes (Unsigned)
1445-OK to give fluids over 3 hrs per Dr. Cyndie Chime.

## 2013-07-01 NOTE — Telephone Encounter (Signed)
Sister, Jamie Diaz calls very concerned about patient.  She had to call 911 for her this weekend.  She was jerking and doing poorly.  Paramedics did not take her to the hospital as she started feeling better according to her sister.  She is nauseated, loosing weight and not eating.  She has uncontrolled diarrhea on her chemo pill - Nexavar.  Sister feels like they do not have a good understanding of her chemotherapy pills and how to use her nausea medications.  Will discuss patients status with Dr. Cyndie Chime and call her back at [phone]. 10:27am  Spoke with sister and requested that she bring her sister in to see Dr.Granfortuna today.  She will bring her this morning.

## 2013-07-01 NOTE — Patient Instructions (Signed)
Dehydration, Adult  Dehydration means your body does not have as much fluid as it needs. Your kidneys, brain, and heart will not work properly without the right amount of fluids and salt.   HOME CARE   Ask your doctor how to replace body fluid losses (rehydrate).   Drink enough fluids to keep your pee (urine) clear or pale yellow.   Drink small amounts of fluids often if you feel sick to your stomach (nauseous) or throw up (vomit).   Eat like you normally do.   Avoid:   Foods or drinks high in sugar.   Bubbly (carbonated) drinks.   Juice.   Very hot or cold fluids.   Drinks with caffeine.   Fatty, greasy foods.   Alcohol.   Tobacco.   Eating too much.   Gelatin desserts.   Wash your hands to avoid spreading germs (bacteria, viruses).   Only take medicine as told by your doctor.   Keep all doctor visits as told.  GET HELP RIGHT AWAY IF:    You cannot drink something without throwing up.   You get worse even with treatment.   Your vomit has blood in it or looks greenish.   Your poop (stool) has blood in it or looks black and tarry.   You have not peed in 6 to 8 hours.   You pee a small amount of very dark pee.   You have a fever.   You pass out (faint).   You have belly (abdominal) pain that gets worse or stays in one spot (localizes).   You have a rash, stiff neck, or bad headache.   You get easily annoyed, sleepy, or are hard to wake up.   You feel weak, dizzy, or very thirsty.  MAKE SURE YOU:    Understand these instructions.   Will watch your condition.   Will get help right away if you are not doing well or get worse.  Document Released: 06/25/2009 Document Revised: 11/21/2011 Document Reviewed: 04/18/2011  Exit[employer] Patient Information 2014 [employer].

## 2013-07-02 NOTE — Progress Notes (Signed)
Hematology and Oncology Follow Up Visit  Jamie Diaz [id number] 04-03-1961 52 y.o. 07/02/2013 8:55 PM   Principle Diagnosis: Encounter Diagnoses  Name Primary?  . Hepatocellular carcinoma Yes  . Cirrhosis of liver   . Hepatitis C, chronic   . Diarrhea with dehydration      Interim History:   Work in visit for this rather complicated 1 year old woman former alcohol abuser, chronic hepatitis C infection, concomitant diagnosis of invasive breast cancer and hepatocellular carcinoma in May of 2011 when she was living in [place]. Following neoadjuvant chemotherapy for the breast cancer, she underwent a combined left mastectomy and a liver resection on 07/16/2010. Please see my initial office evaluation dated 04/10/2013 for full details. She was re evaluated when she moved back to [place] in July of this year. She was found to have a markedly elevated alpha-fetoprotein tumor marker. MRI of the liver on August 7 showed a 2.9 x 4.4 cm lymph node mass in the porta hepatis with additional 2 x 2.4 cm peripancreatic lymph node. There were 2 small indeterminate areas in the liver 12 mm in the left lobe and a questionable 6 mm subcapsular lesion anterior segment right lobe. CT scan of the chest abdomen and pelvis confirmed the 2 areas of abnormal lymphadenopathy, showed mild cirrhotic changes in the liver, and mild splenomegaly. No obvious tumor seen in the liver by CT criteria.  I reviewed her scans with our interventional radiologists. Due to proximity of major vascular structures she was not a candidate for a radiofrequency ablation procedure. I planned to get a radiation oncology opinion since she appears to have just localized disease at this time. In the interim I elected to start her on Sorafenib 400 mg twice daily. 2 days ago she was found semi-responsive with myoclonic jerking by her twin sister. She called 911. When the ambulance arrived she appeared to be stable and she declined transport to the  emergency department. .  She subsequently developed profuse diarrhea andeneralized weakness and called our office this morning for further evaluation.  I think all of her symptoms are due to the sorafenib.   Medications: reviewed  Allergies:  Allergies  Allergen Reactions  . Ibuprofen Hives and Palpitations    Review of Systems: Hematology: negative for swollen glands, easy bruising, epistaxis, gum bleeding, hematuria, hematochezia ENT ROS: negative for - oral lesions or sore throat Breast ROS: Respiratory ROS: negative for - cough, pleuritic pain, shortness of breath or wheezing Cardiovascular ROS: negative for - chest pain, dyspnea on exertion, edema, irregular heartbeat, murmur, orthopnea, palpitations, paroxysmal nocturnal dyspnea or rapid heart rate Gastrointestinal ROS: negative for - abdominal pain, , blood in stools, change in bowel habits, constipation, diarrhea, heartburn, hematemesis, melena, she has had nausea nausea/and dry heaves  Genito-Urinary ROS: negative for  dysuria, hematuria, incontinence,  nocturia or urinary frequency/urgency Musculoskeletal ROS: negative for - joint pain, joint stiffness, joint swelling, positive generalized weakness  Neurological ROS: No headache or change in vision. No focal weakness. Myoclonic jerks appear to be medication related and not a seizure. Dermatological ROS: Sr. has noted a rash in her bib area, no ecchymosis Remaining ROS negative.  Physical Exam: Blood pressure 156/94, pulse 95, temperature 97.1 F (36.2 C), temperature source Oral, resp. rate 19, height 5\' 4"  (1.626 m), weight 113 lb 14.4 oz (51.665 kg). Wt Readings from Last 3 Encounters:  07/01/13 113 lb 14.4 oz (51.665 kg)  05/27/13 120 lb 9.6 oz (54.704 kg)  04/10/13 123 lb 1.6 oz (55.838 kg)  General appearance: Acutely ill-appearing Caucasian woman HENNT: Pharynx no erythema, exudate, mass, or ulcer. No thyromegaly or thyroid nodules Lymph nodes: No cervical,  supraclavicular, or axillary lymphadenopathy Breasts: Left mastectomy Lungs: Clear to auscultation, resonant to percussion throughout Heart: Regular rhythm, no murmur, no gallop, no rub, no click, no edema Abdomen: Soft, nontender, normal bowel sounds, no mass, no organomegaly Extremities: No edema, no calf tenderness Musculoskeletal: no joint deformities GU Vascular: Carotid pulses 2+, no bruits, Neurologic: Alert, oriented, PERRLA,cranial nerves grossly normal, motor strength 5 over 5, reflexes 1+ symmetric, upper body coordination normal, gait normal, Skin: Spider hemangiomas in the bib area no ecchymosis  Lab Results: CBC W/Diff    Component Value Date/Time   WBC 2.9* 07/01/2013 1143   RBC 4.63 07/01/2013 1143   HGB 15.2 07/01/2013 1143   HCT 44.6 07/01/2013 1143   PLT 39* 07/01/2013 1143   MCV 96.4 07/01/2013 1143   MCH 32.9 07/01/2013 1143   MCHC 34.1 07/01/2013 1143   RDW 15.2* 07/01/2013 1143   LYMPHSABS 0.7* 07/01/2013 1143   MONOABS 0.2 07/01/2013 1143   EOSABS 0.1 07/01/2013 1143   BASOSABS 0.0 07/01/2013 1143     Chemistry      Component Value Date/Time   NA 133* 07/01/2013 1143   K 3.9 07/01/2013 1143   CO2 25 07/01/2013 1143   BUN 7.3 07/01/2013 1143   CREATININE 0.9 07/01/2013 1143      Component Value Date/Time   CALCIUM 8.3* 07/01/2013 1143   ALKPHOS 105 07/01/2013 1143   AST 179* 07/01/2013 1143   ALT 75* 07/01/2013 1143   BILITOT 3.23* 07/01/2013 1143       Impression:  #1. Sorafenib toxicity I am giving the patient 1 L of IV saline plus oral potassium in our office today. She is advised to stop the sorafenib. She is encouraged to use Imodium for any further diarrhea. I will reevaluate her next week and if stable resume the sorafenib at a 50% dose reduction.  #2. A locally recurrent hepatocellular carcinoma. I will proceed with a radiation oncology consultation.  #3. 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 History of alcohol abuse  #5. Hepatitis C She did get an appointment to see Dr. Vanessa Barbara although I think treatment of her hepatitis C at this point is likely not indicated.  #6. Alcoholic cirrhosis  #7. Anxiety and depression   CC: Patient Care Team: No Pcp Per Patient as PCP - General (General Practice)   Levert Feinstein, MD 10/21/20148:55 PM

## 2013-07-03 ENCOUNTER — Ambulatory Visit: Payer: Medicare Other | Admitting: Radiation Oncology

## 2013-07-03 ENCOUNTER — Ambulatory Visit: Payer: Medicare Other

## 2013-07-03 ENCOUNTER — Encounter: Payer: Self-pay | Admitting: Radiation Oncology

## 2013-07-03 NOTE — Progress Notes (Signed)
GI Location of Tumor / Histology:hepatocellular carcinoma  Patient presented when she moved back to [facility] in July of this year. She was found to have a markedly elevated alpha-fetoprotein tumor marker. MRI of the liver on August 7 showed a 2.9 x 4.4 cm lymph node mass in the porta hepatis with additional 2 x 2.4 cm peripancreatic lymph node. There were 2 small indeterminate areas in the liver 12 mm in the left lobe and a questionable 6 mm subcapsular lesion anterior segment right lobe. CT scan of the chest abdomen and pelvis confirmed the 2 areas of abnormal lymphadenopathy, showed mild cirrhotic changes in the liver, and mild splenomegaly. No obvious tumor seen in the liver by CT criteria.  Biopsies of 08/02/10 (if applicable) revealed: poorly differentiated carcinoma compatible with hepatocellular carcinoma  Past/Anticipated interventions by surgeon, if any: partial hepatectomy on 07/26/2010.  Past/Anticipated interventions by medical oncology, if any: Sorafenib 400 mg twice daily which was stopped on 07/02/2013 due to toxicity.  May restart on 50% reduced dose.  Weight changes, if any: has lost 11 lbs in the last month.  Bowel/Bladder complaints, if any: having diarrhea from the sorafenib.  It is down to once a day.  Nausea / Vomiting, if any: dry heaves daily.  Taking zofran.  Pain issues, if any:  no  SAFETY ISSUES:  Prior radiation? no  Pacemaker/ICD? no  Possible current pregnancy? no  Is the patient on methotrexate? no  Current Complaints / other details:  Had left breast cancer, is hepatitis c postive.  Patient is here with her twin sister.  She currently smokes less than half a pack per day.  She also drinks 3 glasses of wine per day.

## 2013-07-04 ENCOUNTER — Other Ambulatory Visit: Payer: Self-pay | Admitting: *Deleted

## 2013-07-04 ENCOUNTER — Ambulatory Visit
Admission: RE | Admit: 2013-07-04 | Discharge: 2013-07-04 | Disposition: A | Discharge status: Home / Self Care | Payer: Medicare Other | Source: Ambulatory Visit | Attending: Radiation Oncology | Admitting: Radiation Oncology

## 2013-07-04 ENCOUNTER — Encounter: Payer: Self-pay | Admitting: Radiation Oncology

## 2013-07-04 VITALS — BP 137/91 | HR 84 | Temp 97.7°F | Ht 64.0 in | Wt 112.6 lb

## 2013-07-04 DIAGNOSIS — C220 Liver cell carcinoma: Secondary | ICD-10-CM

## 2013-07-04 DIAGNOSIS — B182 Chronic viral hepatitis C: Secondary | ICD-10-CM | POA: Insufficient documentation

## 2013-07-04 DIAGNOSIS — C50919 Malignant neoplasm of unspecified site of unspecified female breast: Secondary | ICD-10-CM

## 2013-07-04 DIAGNOSIS — C228 Malignant neoplasm of liver, primary, unspecified as to type: Secondary | ICD-10-CM | POA: Insufficient documentation

## 2013-07-04 DIAGNOSIS — R51 Headache: Secondary | ICD-10-CM | POA: Insufficient documentation

## 2013-07-04 DIAGNOSIS — R197 Diarrhea, unspecified: Secondary | ICD-10-CM | POA: Insufficient documentation

## 2013-07-04 DIAGNOSIS — Z9010 Acquired absence of unspecified breast and nipple: Secondary | ICD-10-CM | POA: Insufficient documentation

## 2013-07-04 DIAGNOSIS — R5381 Other malaise: Secondary | ICD-10-CM | POA: Insufficient documentation

## 2013-07-04 DIAGNOSIS — Z853 Personal history of malignant neoplasm of breast: Secondary | ICD-10-CM | POA: Insufficient documentation

## 2013-07-04 DIAGNOSIS — F17200 Nicotine dependence, unspecified, uncomplicated: Secondary | ICD-10-CM | POA: Insufficient documentation

## 2013-07-04 HISTORY — DX: Malignant neoplasm of unspecified site of unspecified female breast: C50.919

## 2013-07-04 MED ORDER — ALPRAZOLAM 0.5 MG PO TABS: 0.5000 mg | ORAL_TABLET | Freq: Three times a day (TID) | ORAL | Status: DC | PRN

## 2013-07-04 NOTE — Progress Notes (Signed)
Please see the Nurse Progress Note in the MD Initial Consult Encounter for this patient. 

## 2013-07-04 NOTE — Progress Notes (Signed)
Radiation Oncology         [phone]) [id number] ________________________________  Initial outpatient Consultation  Name: Jamie Diaz MRN: [id number]  Date: 07/04/2013  DOB: 10-Nov-1960  CC:No PCP Per Patient  Levert Feinstein, MD   REFERRING PHYSICIAN: Levert Feinstein, MD  DIAGNOSIS: Probable recurrent hepatocellular carcinoma   HISTORY OF PRESENT ILLNESS::Jamie Diaz is a 52 y.o. female who is seen out of the courtesy of Dr. Cyndie Chime for an opinion concerning radiation therapy for what appears to be recurrent hepatocellular carcinoma.. The patient was diagnosed with both invasive left breast cancer and hepatocellular carcinoma in May of 2011. Patient underwent neoadjuvant chemotherapy for breast cancer followed by a left mastectomy. At the same time the patient underwent liver resection for hepatocellular carcinoma. Patient did well for approximately 2 years when she was found to have elevated tumor markers. The patient moved back to the [place] area in July of this year. She established followup with medical oncology. Repeat workup at that time revealed a markedly elevated alpha-fetoprotein of 2465.5 nanograms per milliliter. Imaging included MRI of the abdomen and a CT scan of the chest abdomen and pelvis.  Scans revealed enlarged nodes at the porta hepatis region( 2.9 x 4.3 cm) and the peri-pancreatic nodal area (2.4 x 2.5 cm) the patient recently started Nexavar which she tolerated poorly. She is now seen in radiation oncology for consideration for treatment.  PREVIOUS RADIATION THERAPY: No  PAST MEDICAL HISTORY:  has a past medical history of Cancer; Hepatitis C, chronic (04/10/2013); Hepatocellular carcinoma (04/18/2013); and Breast cancer (2011).    PAST SURGICAL HISTORY: Past Surgical History  Procedure Laterality Date  . Appendectomy    . Mastectomy, radical Left 07/26/2010  . Open partial hepatectomy [83]  07/26/2010  . Port-a-cath removal  03/2010  . Port-a-cath removal   10/2011    piece left in that had to be removed    FAMILY HISTORY: family history includes Colon cancer in her mother; Heart disease in her father.  SOCIAL HISTORY:  reports that she has been smoking Cigarettes.  She started smoking about 40 years ago. She has been smoking about 0.50 packs per day. She does not have any smokeless tobacco history on file. She reports that she drinks about 10.5 ounces of alcohol per week. She reports that she does not use illicit drugs.  ALLERGIES: Ibuprofen  MEDICATIONS:  Current Outpatient Prescriptions  Medication Sig Dispense Refill  . ALPRAZolam (XANAX) 0.5 MG tablet Take 1 tablet (0.5 mg total) by mouth 3 (three) times daily as needed for sleep or anxiety.  90 tablet  0  . citalopram (CELEXA) 20 MG tablet Take 1 tablet (20 mg total) by mouth daily.  30 tablet  1  . loperamide (IMODIUM) 2 MG capsule Take 2 mg by mouth 4 (four) times daily as needed for diarrhea or loose stools.      . ondansetron (ZOFRAN-ODT) 4 MG disintegrating tablet 1 - 2 sublingual every 8 hours as needed for nausea  30 tablet  prn  . magnesium oxide (MAG-OX) 400 (241.3 MG) MG tablet Take 1 tablet (400 mg total) by mouth 2 (two) times daily. For cramps  60 tablet  prn  . SORAfenib (NEXAVAR) 200 MG tablet Take 400 mg by mouth 2 (two) times daily. Give on an empty stomach 1 hour before or 2 hours after meals.      . traMADol (ULTRAM) 50 MG tablet 1-2 tabs by mouth every 6 hours if needed for pain  100  tablet  1   No current facility-administered medications for this encounter.    REVIEW OF SYSTEMS:  A 15 point review of systems is documented in the electronic medical record. This was obtained by the nursing staff. However, I reviewed this with the patient to discuss relevant findings. she complains of chronic frontal/temporal headaches. She denies any abdominal pain at this time. She denies any back pain. She has had some diarrhea related to her Nexavar. she does have a poor appetite at  this time. She denies any focal motor weakness.  She does have a lot of fatigue at this time.   PHYSICAL EXAM:  height is 5\' 4"  (1.626 m) and weight is 112 lb 9.6 oz (51.075 kg). Her temperature is 97.7 F (36.5 C). Her blood pressure is 137/91 and her pulse is 84. Her oxygen saturation is 99%.   BP 137/91  Pulse 84  Temp(Src) 97.7 F (36.5 C)  Ht 5\' 4"  (1.626 m)  Wt 112 lb 9.6 oz (51.075 kg)  BMI 19.32 kg/m2  SpO2 99%  General Appearance:    Alert, cooperative, no distress, appears older than stated age, she is accompanied by her twin sister today. She appears to be in good spirits   Head:    Normocephalic, without obvious abnormality, atraumatic  Eyes:    PERRL, some yellowing of the sclera, EOM's intact,       Nose:   Nares normal, septum midline, mucosa normal, no drainage    or sinus tenderness  Throat:   Lips, mucosa, and tongue normal; partials in place gums normal  Neck:   Supple, symmetrical, trachea midline, no adenopathy;    thyroid:  no enlargement/tenderness/nodules; no carotid   bruit or JVD  Back:     Symmetric, no curvature, ROM normal, no CVA tenderness  Lungs:     Clear to auscultation bilaterally, respirations unlabored  Chest Wall:    mastectomy scar along the left side. No palpable or visible signs recurrence    Heart:    Regular rate and rhythm, S1 and S2 normal, no murmur, rub   or gallop  Breast Exam:      right breast, No tenderness, masses, or nipple abnormality  Abdomen:     Soft, non-tender, bowel sounds active all four quadrants,    no masses, no  palpable organomegaly, vertical scar in the upper abdomen from her prior liver resection         Extremities:   Extremities normal, atraumatic, no cyanosis or edema  Pulses:   2+ and symmetric all extremities  Skin:   Skin color, texture, turgor normal, no rashes or lesions  Lymph nodes:   Cervical, supraclavicular, and axillary nodes normal  Neurologic:    normal strength   throughout    KPS = 80  100 -  Normal; no complaints; no evidence of disease. 90   - Able to carry on normal activity; minor signs or symptoms of disease. 80   - Normal activity with effort; some signs or symptoms of disease. 74   - Cares for self; unable to carry on normal activity or to do active work. 60   - Requires occasional assistance, but is able to care for most of his personal needs. 50   - Requires considerable assistance and frequent medical care. 40   - Disabled; requires special care and assistance. 30   - Severely disabled; hospital admission is indicated although death not imminent. 20   - Very sick; hospital admission necessary;  active supportive treatment necessary. 10   - Moribund; fatal processes progressing rapidly. 0     - Dead  Karnofsky DA, Abelmann WH, Craver LS and Burchenal [facility] [phone]) The use of the nitrogen mustards in the palliative treatment of carcinoma: with particular reference to bronchogenic carcinoma Cancer 1 634-56  LABORATORY DATA:  Lab Results  Component Value Date   WBC 2.9* 07/01/2013   HGB 15.2 07/01/2013   HCT 44.6 07/01/2013   MCV 96.4 07/01/2013   PLT 39* 07/01/2013   Lab Results  Component Value Date   NA 133* 07/01/2013   K 3.9 07/01/2013   CO2 25 07/01/2013   Lab Results  Component Value Date   ALT 75* 07/01/2013   AST 179* 07/01/2013   ALKPHOS 105 07/01/2013   BILITOT 3.23* 07/01/2013     RADIOGRAPHY: No results found.    IMPRESSION: Recurrent hepatocellular carcinoma. She appears to have localized disease in the peri-pancreatic and porta hepatis nodal areas. She would be a candidate for palliative radiation therapy directed at these areas of recurrence. I discussed the overall treatment course side effects and potential toxicities of radiation therapy in this situation with the patient and her sister. The patient appears to understand and wishes to proceed with treatment if clinically indicated. According to the patient there is some discussion as to whether  she may potentially undergo surgery with the liver transplant but there is no documentation of this issue with in her chart.  PLAN: Medical oncology  follow early next week. Final management details are pending at this time. I spent 60 minutes minutes face to face with the patient and more than 50% of that time was spent in counseling and/or coordination of care.   ------------------------------------------------  -----------------------------------  Billie Lade, PhD, MD

## 2013-07-09 ENCOUNTER — Ambulatory Visit (HOSPITAL_BASED_OUTPATIENT_CLINIC_OR_DEPARTMENT_OTHER): Payer: Medicare Other | Admitting: Oncology

## 2013-07-09 ENCOUNTER — Other Ambulatory Visit: Payer: Self-pay | Admitting: Radiation Oncology

## 2013-07-09 ENCOUNTER — Other Ambulatory Visit (HOSPITAL_BASED_OUTPATIENT_CLINIC_OR_DEPARTMENT_OTHER): Payer: Medicare Other | Admitting: Lab

## 2013-07-09 ENCOUNTER — Telehealth: Payer: Self-pay | Admitting: Oncology

## 2013-07-09 VITALS — BP 133/84 | HR 95 | Temp 97.4°F | Resp 18 | Ht 64.0 in | Wt 112.5 lb

## 2013-07-09 DIAGNOSIS — K7460 Unspecified cirrhosis of liver: Secondary | ICD-10-CM

## 2013-07-09 DIAGNOSIS — C228 Malignant neoplasm of liver, primary, unspecified as to type: Secondary | ICD-10-CM

## 2013-07-09 DIAGNOSIS — C220 Liver cell carcinoma: Secondary | ICD-10-CM

## 2013-07-09 DIAGNOSIS — C50912 Malignant neoplasm of unspecified site of left female breast: Secondary | ICD-10-CM

## 2013-07-09 DIAGNOSIS — B182 Chronic viral hepatitis C: Secondary | ICD-10-CM

## 2013-07-09 DIAGNOSIS — R197 Diarrhea, unspecified: Secondary | ICD-10-CM

## 2013-07-09 DIAGNOSIS — C50919 Malignant neoplasm of unspecified site of unspecified female breast: Secondary | ICD-10-CM

## 2013-07-09 LAB — COMPREHENSIVE METABOLIC PANEL (CC13)
ALT: 60 U/L — ABNORMAL HIGH (ref 0–55)
AST: 131 U/L — ABNORMAL HIGH (ref 5–34)
Albumin: 3.2 g/dL — ABNORMAL LOW (ref 3.5–5.0)
Anion Gap: 12 mEq/L — ABNORMAL HIGH (ref 3–11)
CO2: 24 mEq/L (ref 22–29)
Calcium: 9 mg/dL (ref 8.4–10.4)
Chloride: 99 mEq/L (ref 98–109)
Creatinine: 0.8 mg/dL (ref 0.6–1.1)
Glucose: 132 mg/dl (ref 70–140)
Potassium: 3.3 mEq/L — ABNORMAL LOW (ref 3.5–5.1)
Total Protein: 9.1 g/dL — ABNORMAL HIGH (ref 6.4–8.3)

## 2013-07-09 LAB — CBC WITH DIFFERENTIAL/PLATELET
BASO%: 0.7 % (ref 0.0–2.0)
EOS%: 2.3 % (ref 0.0–7.0)
HCT: 40.2 % (ref 34.8–46.6)
HGB: 13.7 g/dL (ref 11.6–15.9)
MCH: 33 pg (ref 25.1–34.0)
MCHC: 34 g/dL (ref 31.5–36.0)
MCV: 97.1 fL (ref 79.5–101.0)
MONO#: 0.3 10*3/uL (ref 0.1–0.9)
NEUT%: 53 % (ref 38.4–76.8)
Platelets: 45 10*3/uL — ABNORMAL LOW (ref 145–400)
RDW: 16.2 % — ABNORMAL HIGH (ref 11.2–14.5)
WBC: 2.7 10*3/uL — ABNORMAL LOW (ref 3.9–10.3)
lymph#: 0.9 10*3/uL (ref 0.9–3.3)

## 2013-07-09 NOTE — Telephone Encounter (Signed)
Gave pt appt for lab and ML on November 2014 °

## 2013-07-09 NOTE — Progress Notes (Signed)
Hematology and Oncology Follow Up Visit  Jamie Diaz [id number] [birth year] 52 y.o. 07/09/2013 8:45 PM   Principle Diagnosis: Encounter Diagnoses  Name Primary?  . Hepatocellular carcinoma Yes  . Breast cancer, left   . Cirrhosis of liver   . Hepatitis C, chronic      Interim History:   Short interim followup visit for this 52 year old woman with a concomitant diagnosis of T2 N0 ER negative breast cancer and stage I hepatocellular carcinoma 3 years ago while living in [place]. At time of her first visit with me in [place] on 04/10/2013, lab database revealed a marked elevation of alpha-fetoprotein tumor marker. Subsequent MRI scan and CT scan of the abdomen revealed periportal and peripancreatic malignant appearing lymphadenopathy. I started her on a trial of sorafenib pending further evaluation to see if she was a candidate for surgery, radiation, or a local ablation procedure. She only took the sorafenib for about 2 weeks before she developed significant toxicity with explosive unremitting diarrhea. She was given intravenous hydration in our office last week on October 21. She was instructed to stop the sorafenib. She was encouraged to use Imodium. Toxicity has significantly improved. She is down to 2 loose bowel movements daily and still using the Imodium as needed. She is not eating. She is getting down some clear liquids. Weight is down from 123 pounds in July to 112 pounds today.  At time of her first visit, she indicated that she had untreated hepatitis C infection. Before I was aware that her liver cancer had relapsed, I suggested that we get her in touch with a hepatologist for further evaluation. Her primary care physician  initiated this evaluation and the patient just had an outpatient consultation supervised by a doctor Zamor who is running an outreach clinic affiliated with [facility]. Patient subsequently received a letter for a more extended evaluation  in [place].  Medications: reviewed  Allergies:  Allergies  Allergen Reactions  . Ibuprofen Hives and Palpitations    Review of Systems: Hematology: negative for swollen glands, easy bruising,  ENT ROS: negative for - oral lesions or sore throat Respiratory ROS: negative for - cough, pleuritic pain, shortness of breath or wheezing Cardiovascular ROS: negative for - chest pain, dyspnea on exertion, edema, irregular heartbeat, murmur, orthopnea, palpitations, paroxysmal nocturnal dyspnea or rapid heart rate Gastrointestinal ROS: See above Genito-Urinary ROS: negative for - , dysuria, hematuria, incontinence, ,  urinary frequency/urgency Musculoskeletal ROS: negative for - joint pain, joint stiffness, joint swelling, muscle pain, muscular weakness  Neurological ROS: negative for - behavioral changes, confusion, dizziness, gait disturbance, headaches, impaired coordination/balance, memory loss, numbness/tingling,  Dermatological ROS: negative for rash, ecchymosis Remaining ROS negative.  Physical Exam: Blood pressure 133/84, pulse 95, temperature 97.4 F (36.3 C), temperature source Oral, resp. rate 18, height 5\' 4"  (1.626 m), weight 112 lb 8 oz (51.03 kg), SpO2 98.00%. Wt Readings from Last 3 Encounters:  07/09/13 112 lb 8 oz (51.03 kg)  07/04/13 112 lb 9.6 oz (51.075 kg)  07/01/13 113 lb 14.4 oz (51.665 kg)     General appearance:  HENNT: Pharynx no erythema, exudate, mass, or ulcer. No thyromegaly or thyroid nodules Lymph nodes: No cervical, supraclavicular, or axillary lymphadenopathy Breasts: See recent exam. Not repeated today. Lungs: Clear to auscultation, resonant to percussion throughout Heart: Regular rhythm, no murmur, no gallop, no rub, no click, no edema Abdomen: Soft, nontender, normal bowel sounds, no mass, no organomegaly Extremities: No edema, no calf tenderness Musculoskeletal: no joint  deformities GU: Vascular: Carotid pulses 2+, no bruits,  Neurologic:  Alert, oriented, PERRLA, , cranial nerves grossly normal, motor strength 5 over 5, reflexes 1+ symmetric, upper body coordination normal, gait normal, Skin: No rash or ecchymosis  Lab Results: CBC W/Diff    Component Value Date/Time   WBC 2.7* 07/09/2013 1101   RBC 4.14 07/09/2013 1101   HGB 13.7 07/09/2013 1101   HCT 40.2 07/09/2013 1101   PLT 45* 07/09/2013 1101   MCV 97.1 07/09/2013 1101   MCH 33.0 07/09/2013 1101   MCHC 34.0 07/09/2013 1101   RDW 16.2* 07/09/2013 1101   LYMPHSABS 0.9 07/09/2013 1101   MONOABS 0.3 07/09/2013 1101   EOSABS 0.1 07/09/2013 1101   BASOSABS 0.0 07/09/2013 1101     Chemistry      Component Value Date/Time   NA 134* 07/09/2013 1101   K 3.3* 07/09/2013 1101   CO2 24 07/09/2013 1101   BUN 6.5* 07/09/2013 1101   CREATININE 0.8 07/09/2013 1101      Component Value Date/Time   CALCIUM 9.0 07/09/2013 1101   ALKPHOS 104 07/09/2013 1101   AST 131* 07/09/2013 1101   ALT 60* 07/09/2013 1101   BILITOT 1.22* 07/09/2013 1101      Impression:  #1. Metastatic recurrence of hepatocellular carcinoma Disease does appear amenable to palliative radiation and I discussed management with Dr. Antony Blackbird, radiation oncology. Location of the main ymph node mass presents a danger of causing biliary tract obstruction. She will get simulated tomorrow and begin a palliative course of radiation. Although disease appears to be localized, there are 2 small abnormalities seen on MRI but not seen well on CT scan that suggests she may also have involvement at the primary site in her liver. For this reason, once toxicity from sorafenib resolved, I will likely start her back on the drug at a 50% dose reduction.  #2. Early sorafenib GI toxicity Resolving off the drug. See discussion above  #3. Stage II, T2, N0, ER-negative, cancer left breast treated with mastectomy followed by chemotherapy. Breast cancer tumor marker not elevated.  #4. Hepatitis C. This context low  priority in view of the above. I will not send her to [facility] at this time.   CC: Patient Care Team: No Pcp Per Patient as PCP - General (General Practice) Dr. Theadora Rama Zamore/ Alisia Ferrari NP, [facility], [address] [place], [facility]  Levert Feinstein, MD 10/28/20148:45 PM

## 2013-07-10 NOTE — Progress Notes (Unsigned)
[facility] Comprehensive Psychosocial Assessment Clinical Social Work  Clinical Social Work was referred by distress screening. Patient scored a 10, indicating severe stress.  Clinical Social Worker Intern phoned to assess psychosocial, emotional, mental health, and spiritual needs of the patient.  Patient's knowledge about cancer and its treatment including level of understanding, reactions, goals for care, and expectations:   Patient stated being overwhelmed by diagnosis. Patient discussed having a reaction to chemo and that as a result had to stop.  Patient confirmed current treatment to include radiation and then a follow-up with a lower dosage chemo. Patient also stated other health issues that have to be addressed but understands that the cancer is priority for treatment at this time.  Patient mentioned she "just wants a prognosis".   Characteristics of the patient's support system: Patient stated that she is currently living with her sister who is helping her out. Patient also mentioned assistance from a good friend and an "ex".  Patient has several grandchildren that she enjoys spending time with and looks forward to the arrival of a new grandchild in December. Patient and family psychosocial functioning including strengths, limitations, and coping skills: Patient stated struggles with depression to the extent that there are days when she "cannot get out of bed or take a shower"  CSWI noted during conversation frequent moments of crying. Patient added that she has no appetite. CSWI inquired about possible conversation with her doctor about the depression and sleeplessness.  Patient stated she had been put on an antidepressant and Xanax.  Patient stated that she was told the Xanax would help her sleep but said it had not.  Patient stated there are many nights she is awake all night.  CSWI encouraged Patient to revisit concerns with doctor. Identifications of barriers to care: Patient has concerns about  consistent transportation.  CSWI told Patient that may be something SW can assist with. Availability of community resources:  Clinical Social Worker follow up needed: yes CSWI validated Patient's concerns and emotions. If yes, follow up plan:  CSWI arranged for Patient to meet with LCSW during her next visit to Cancer Center.  Patient stated that she would come after next scheduled treatment appointment.

## 2013-07-11 ENCOUNTER — Ambulatory Visit
Admission: RE | Admit: 2013-07-11 | Discharge: 2013-07-11 | Disposition: A | Discharge status: Home / Self Care | Payer: Medicare Other | Source: Ambulatory Visit | Attending: Radiation Oncology | Admitting: Radiation Oncology

## 2013-07-11 VITALS — BP 126/84 | HR 91 | Temp 98.0°F

## 2013-07-11 DIAGNOSIS — R112 Nausea with vomiting, unspecified: Secondary | ICD-10-CM | POA: Insufficient documentation

## 2013-07-11 DIAGNOSIS — Z79899 Other long term (current) drug therapy: Secondary | ICD-10-CM | POA: Insufficient documentation

## 2013-07-11 DIAGNOSIS — C228 Malignant neoplasm of liver, primary, unspecified as to type: Secondary | ICD-10-CM | POA: Insufficient documentation

## 2013-07-11 DIAGNOSIS — K5900 Constipation, unspecified: Secondary | ICD-10-CM | POA: Insufficient documentation

## 2013-07-11 DIAGNOSIS — C220 Liver cell carcinoma: Secondary | ICD-10-CM

## 2013-07-11 DIAGNOSIS — Z510 Encounter for antineoplastic radiation therapy: Secondary | ICD-10-CM | POA: Insufficient documentation

## 2013-07-11 MED ORDER — SODIUM CHLORIDE 0.9 % IJ SOLN
10.0000 mL | Freq: Once | INTRAMUSCULAR | Status: AC
Start: 2013-07-11 — End: 2013-07-11
  Administered 2013-07-11: 10 mL via INTRAVENOUS

## 2013-07-11 NOTE — Progress Notes (Signed)
no allergies, not diabetic ,BUN=7.3,CR=0.9 on 07/01/13, patient gave name and dob as identification,[name],RN attempted IV x 1 rfa, unsuccessful, I introduced myself to patient and spouse, again name and dob as identification by patient, unable to use left arm, patient with mastectomy 2 years ago, only other site available was RAC, , cleansed site Chlorhexidine gluconate, attempted x1 22#g 1 inch catheter needle, excellent blood return, flushed with 10ml Normal Saline, op site placed over site, and taped,, patient tolerated well, no c/o pain .called ct sim ,[place] therapist escorted patient to ct room, spose escorted to waiting room in rad/onc,

## 2013-07-13 NOTE — Progress Notes (Signed)
  Radiation Oncology         [phone] ________________________________  Name: Jamie Diaz MRN: [id number]  Date: 07/11/2013  DOB: Jul 26, 1961  SIMULATION AND TREATMENT PLANNING NOTE  DIAGNOSIS:  Recurrent hepatocellular carcinoma  NARRATIVE:  The patient was brought to the CT Simulation planning suite.  Identity was confirmed.  All relevant records and images related to the planned course of therapy were reviewed.  The patient freely provided informed written consent to proceed with treatment after reviewing the details related to the planned course of therapy. The consent form was witnessed and verified by the simulation staff.  Then, the patient was set-up in a stable reproducible  supine position for radiation therapy.  CT images were obtained.  Surface markings were placed.  The CT images were loaded into the planning software.  Then the target and avoidance structures were contoured.  Treatment planning then occurred.  The radiation prescription was entered and confirmed.  Then, I designed and supervised the construction of a total of 1 medically necessary complex treatment devices.  I have requested : Intensity Modulated Radiotherapy (IMRT) is medically necessary for this case for the following reason:  Small bowel, liver and kidney sparing..  I have ordered: dose calc.  PLAN:  The patient will receive 50 Gy in 25 fractions with a simultaneous integrated boost directed at the GTV.  ________________________________  -----------------------------------  Billie Lade, PhD, MD

## 2013-07-18 ENCOUNTER — Other Ambulatory Visit: Payer: Self-pay

## 2013-07-19 ENCOUNTER — Ambulatory Visit
Admission: RE | Admit: 2013-07-19 | Discharge: 2013-07-19 | Disposition: A | Discharge status: Home / Self Care | Payer: Medicare Other | Source: Ambulatory Visit | Attending: Radiation Oncology | Admitting: Radiation Oncology

## 2013-07-22 ENCOUNTER — Ambulatory Visit
Admission: RE | Admit: 2013-07-22 | Discharge: 2013-07-22 | Disposition: A | Discharge status: Home / Self Care | Payer: Medicare Other | Source: Ambulatory Visit | Attending: Radiation Oncology | Admitting: Radiation Oncology

## 2013-07-23 ENCOUNTER — Ambulatory Visit
Admission: RE | Admit: 2013-07-23 | Discharge: 2013-07-23 | Disposition: A | Discharge status: Home / Self Care | Payer: Medicare Other | Source: Ambulatory Visit | Attending: Radiation Oncology | Admitting: Radiation Oncology

## 2013-07-23 ENCOUNTER — Encounter: Payer: Self-pay | Admitting: Radiation Oncology

## 2013-07-23 ENCOUNTER — Other Ambulatory Visit: Payer: Self-pay

## 2013-07-23 VITALS — BP 129/80 | HR 98 | Temp 98.0°F | Resp 16 | Wt 114.2 lb

## 2013-07-23 DIAGNOSIS — C220 Liver cell carcinoma: Secondary | ICD-10-CM

## 2013-07-23 NOTE — Telephone Encounter (Signed)
Spoke with pharmacist at [facility] and told her that the PCP was to refill this prescription as noted with prescription written 05-27-13. Pharmacist to be in touch with the patient to see who her PCP is at this time.  No PCP noted in chart.  Possibly referral make to the [facility].

## 2013-07-23 NOTE — Progress Notes (Signed)
Reports that she continues to take Imodium for diarrhea. Reports taking two tablets of imodium yesterday but, none today. Reports yesterday she often felt the urge to defecate but, didn't pass much stool. Reports occasional incontinence of stool. Reports an intense dull headache x2 weeks. Reports dizziness and blurred vision. Reports fatigue. Reports persistent nausea yesterday. Reports xanax tid isn't taking the edge off.

## 2013-07-23 NOTE — Progress Notes (Signed)
[facility]    Radiation Oncology [address] [place]     Maryln Gottron, M.D. [place], [place] [address]               Billie Lade, M.D., Ph.D. Phone: [phone]      Molli Hazard A. Kathrynn Running, M.D. Fax: [phone]      Radene Gunning, M.D., Ph.D.         Lurline Hare, M.D.         Grayland Jack, M.D Weekly Treatment Management Note  Name: Jamie Diaz     MRN: [id number]        CSN: [id number] Date: 07/23/2013      DOB: 28-Sep-1960  CC: No PCP Per Patient         [place]    Status: Outpatient  Diagnosis: The encounter diagnosis was Hepatocellular carcinoma.  Current Dose: 4 Gy  Current Fraction: 2  Planned Dose: 50 Gy  Narrative: Jamie Diaz was seen today for weekly treatment management. The chart was checked and CBCT  were reviewed. She has had some problems with nausea over the past few days but feels a lot of this is anxiety related. She does have Zofran for this issue. She has intermittent diarrhea which is controlled well with Imodium. She denies any abdominal pain.  Ibuprofen  Current Outpatient Prescriptions  Medication Sig Dispense Refill  . ALPRAZolam (XANAX) 0.5 MG tablet Take 1 tablet (0.5 mg total) by mouth 3 (three) times daily as needed for sleep or anxiety.  90 tablet  0  . citalopram (CELEXA) 20 MG tablet Take 1 tablet (20 mg total) by mouth daily.  30 tablet  1  . loperamide (IMODIUM) 2 MG capsule Take 2 mg by mouth 4 (four) times daily as needed for diarrhea or loose stools.      . magnesium oxide (MAG-OX) 400 (241.3 MG) MG tablet Take 1 tablet (400 mg total) by mouth 2 (two) times daily. For cramps  60 tablet  prn  . ondansetron (ZOFRAN-ODT) 4 MG disintegrating tablet 1 - 2 sublingual every 8 hours as needed for nausea  30 tablet  prn  . SORAfenib (NEXAVAR) 200 MG tablet Take 400 mg by mouth 2 (two) times daily. Give on an empty stomach 1 hour before or 2 hours after meals.      . traMADol (ULTRAM) 50 MG tablet 1-2 tabs by mouth every 6  hours if needed for pain  100 tablet  1   No current facility-administered medications for this encounter.   Labs:  Lab Results  Component Value Date   WBC 2.7* 07/09/2013   HGB 13.7 07/09/2013   HCT 40.2 07/09/2013   MCV 97.1 07/09/2013   PLT 45* 07/09/2013   Lab Results  Component Value Date   CREATININE 0.8 07/09/2013   BUN 6.5* 07/09/2013   NA 134* 07/09/2013   K 3.3* 07/09/2013   CO2 24 07/09/2013   Lab Results  Component Value Date   ALT 60* 07/09/2013   AST 131* 07/09/2013   BILITOT 1.22* 07/09/2013    Physical Examination:  weight is 114 lb 3.2 oz (51.801 kg). Her oral temperature is 98 F (36.7 C). Her blood pressure is 129/80 and her pulse is 98. Her respiration is 16.    Wt Readings from Last 3 Encounters:  07/23/13 114 lb 3.2 oz (51.801 kg)  07/09/13 112 lb 8 oz (51.03 kg)  07/04/13 112 lb 9.6 oz (51.075 kg)  Lungs - Normal respiratory effort, chest expands symmetrically. Lungs are clear to auscultation, no crackles or wheezes.  Heart has regular rhythm and rate  Abdomen is soft and non tender with normal bowel sounds  Assessment:  Patient tolerating treatments well  Plan: Continue treatment per original radiation prescription

## 2013-07-24 ENCOUNTER — Ambulatory Visit
Admission: RE | Admit: 2013-07-24 | Discharge: 2013-07-24 | Disposition: A | Discharge status: Home / Self Care | Payer: Medicare Other | Source: Ambulatory Visit | Attending: Radiation Oncology | Admitting: Radiation Oncology

## 2013-07-24 ENCOUNTER — Telehealth: Payer: Self-pay | Admitting: *Deleted

## 2013-07-24 DIAGNOSIS — C220 Liver cell carcinoma: Secondary | ICD-10-CM

## 2013-07-24 MED ORDER — RADIAPLEXRX EX GEL
Freq: Once | CUTANEOUS | Status: AC
  Administered 2013-07-24: 19:00:00 via TOPICAL

## 2013-07-24 NOTE — Progress Notes (Signed)
Post sim ed completed w/pt and friend. Gave pt "Radiation and You" booklet w/all pertinent information marked and discussed, re: diarrhea, fatigue, nausea/vomiting, skin care, nutrition, pain. Gave pt Radiaplex w/instrucitons fo rproper use. Pt verbalized understanding.

## 2013-07-24 NOTE — Telephone Encounter (Signed)
Received request for refill of Citalopram from [facility].  Per rx note on 05/27/13,  Future refill of this med will be from her PCP.  [facility] pharmacy and informed pharmacist of above info.

## 2013-07-25 ENCOUNTER — Ambulatory Visit
Admission: RE | Admit: 2013-07-25 | Discharge: 2013-07-25 | Disposition: A | Discharge status: Home / Self Care | Payer: Medicare Other | Source: Ambulatory Visit | Attending: Radiation Oncology | Admitting: Radiation Oncology

## 2013-07-26 ENCOUNTER — Ambulatory Visit
Admission: RE | Admit: 2013-07-26 | Discharge: 2013-07-26 | Disposition: A | Discharge status: Home / Self Care | Payer: Medicare Other | Source: Ambulatory Visit | Attending: Radiation Oncology | Admitting: Radiation Oncology

## 2013-07-29 ENCOUNTER — Ambulatory Visit
Admission: RE | Admit: 2013-07-29 | Discharge: 2013-07-29 | Disposition: A | Discharge status: Home / Self Care | Payer: Medicare Other | Source: Ambulatory Visit | Attending: Radiation Oncology | Admitting: Radiation Oncology

## 2013-07-30 ENCOUNTER — Encounter: Payer: Self-pay | Admitting: Radiation Oncology

## 2013-07-30 ENCOUNTER — Ambulatory Visit
Admission: RE | Admit: 2013-07-30 | Discharge: 2013-07-30 | Disposition: A | Discharge status: Home / Self Care | Payer: Medicare Other | Source: Ambulatory Visit | Attending: Radiation Oncology | Admitting: Radiation Oncology

## 2013-07-30 VITALS — BP 119/78 | HR 88 | Temp 97.6°F | Resp 20 | Wt 114.9 lb

## 2013-07-30 DIAGNOSIS — C220 Liver cell carcinoma: Secondary | ICD-10-CM

## 2013-07-30 NOTE — Progress Notes (Signed)
[facility]    Radiation Oncology [address] [place]     Maryln Gottron, M.D. [place], [place] [address]               Billie Lade, M.D., Ph.D. Phone: [phone]      Molli Hazard A. Kathrynn Running, M.D. Fax: [phone]      Radene Gunning, M.D., Ph.D.         Lurline Hare, M.D.         Grayland Jack, M.D Weekly Treatment Management Note  Name: Jamie Diaz     MRN: [id number]        CSN: [id number] Date: 07/30/2013      DOB: 10-Jan-1961  CC: No PCP Per Patient         [place]    Status: Outpatient  Diagnosis: The encounter diagnosis was Hepatocellular carcinoma.  Current Dose: 14 gy  Current Fraction: 7  Planned Dose: 50 Gy  Narrative: Ricki Miller Sharpley was seen today for weekly treatment management. The chart was checked and CBCT  were reviewed. She is having some fatigue but did have much of this prior to starting her radiation therapy. She has had some problems with diarrhea and has had some problems with fecal soiling related to this issue. She  also had some intermittent nausea.  Ibuprofen Current Outpatient Prescriptions  Medication Sig Dispense Refill  . ALPRAZolam (XANAX) 0.5 MG tablet Take 1 tablet (0.5 mg total) by mouth 3 (three) times daily as needed for sleep or anxiety.  90 tablet  0  . citalopram (CELEXA) 20 MG tablet Take 1 tablet (20 mg total) by mouth daily.  30 tablet  1  . hyaluronate sodium (RADIAPLEXRX) GEL Apply 1 application topically 2 (two) times daily.      Marland Kitchen loperamide (IMODIUM) 2 MG capsule Take 2 mg by mouth 4 (four) times daily as needed for diarrhea or loose stools.      . magnesium oxide (MAG-OX) 400 (241.3 MG) MG tablet Take 1 tablet (400 mg total) by mouth 2 (two) times daily. For cramps  60 tablet  prn  . ondansetron (ZOFRAN-ODT) 4 MG disintegrating tablet 1 - 2 sublingual every 8 hours as needed for nausea  30 tablet  prn  . SORAfenib (NEXAVAR) 200 MG tablet Take 400 mg by mouth 2 (two) times daily. Give on an empty stomach 1 hour  before or 2 hours after meals.      . traMADol (ULTRAM) 50 MG tablet 1-2 tabs by mouth every 6 hours if needed for pain  100 tablet  1   No current facility-administered medications for this encounter.     Physical Examination:  Filed Vitals:   07/30/13 1812  BP: 119/78  Pulse: 88  Temp: 97.6 F (36.4 C)  Resp: 20    Wt Readings from Last 3 Encounters:  07/30/13 114 lb 14.4 oz (52.118 kg)  07/23/13 114 lb 3.2 oz (51.801 kg)  07/09/13 112 lb 8 oz (51.03 kg)    She is accompanied by her sister today. The patient is somewhat emotional Lungs - Normal respiratory effort, chest expands symmetrically. Lungs are clear to auscultation, no crackles or wheezes.  Heart has regular rhythm and rate  Abdomen is soft and non tender with normal bowel sounds  Assessment:  Patient tolerating treatments well  Plan: Continue treatment per original radiation prescription

## 2013-07-30 NOTE — Progress Notes (Signed)
Pt reports pain 6/10 over her sternum and right shoulder pain she states is from "bone on bone". She states she has not taken Tramadol since beginning radiation and chemotherapy. She states she "is doing enough". She states she had diarrhea x 2 this morning, took Imodium w/relief. She also took Zofran for nausea, vomiting "white phlegm". She has loss of appetite, fatigue.

## 2013-07-31 ENCOUNTER — Ambulatory Visit
Admission: RE | Admit: 2013-07-31 | Discharge: 2013-07-31 | Disposition: A | Discharge status: Home / Self Care | Payer: Medicare Other | Source: Ambulatory Visit | Attending: Radiation Oncology | Admitting: Radiation Oncology

## 2013-08-01 ENCOUNTER — Ambulatory Visit
Admission: RE | Admit: 2013-08-01 | Discharge: 2013-08-01 | Disposition: A | Discharge status: Home / Self Care | Payer: Medicare Other | Source: Ambulatory Visit | Attending: Radiation Oncology | Admitting: Radiation Oncology

## 2013-08-02 ENCOUNTER — Ambulatory Visit
Admission: RE | Admit: 2013-08-02 | Discharge: 2013-08-02 | Disposition: A | Discharge status: Home / Self Care | Payer: Medicare Other | Source: Ambulatory Visit | Attending: Radiation Oncology | Admitting: Radiation Oncology

## 2013-08-04 ENCOUNTER — Ambulatory Visit
Admission: RE | Admit: 2013-08-04 | Discharge: 2013-08-04 | Disposition: A | Discharge status: Home / Self Care | Payer: Medicare Other | Source: Ambulatory Visit | Attending: Radiation Oncology | Admitting: Radiation Oncology

## 2013-08-05 ENCOUNTER — Ambulatory Visit
Admission: RE | Admit: 2013-08-05 | Discharge: 2013-08-05 | Disposition: A | Discharge status: Home / Self Care | Payer: Medicare Other | Source: Ambulatory Visit | Attending: Radiation Oncology | Admitting: Radiation Oncology

## 2013-08-06 ENCOUNTER — Encounter: Payer: Self-pay | Admitting: Nutrition

## 2013-08-06 ENCOUNTER — Ambulatory Visit
Admission: RE | Admit: 2013-08-06 | Discharge: 2013-08-06 | Disposition: A | Discharge status: Home / Self Care | Payer: Medicare Other | Source: Ambulatory Visit | Attending: Radiation Oncology | Admitting: Radiation Oncology

## 2013-08-06 ENCOUNTER — Encounter: Payer: Self-pay | Admitting: Radiation Oncology

## 2013-08-06 ENCOUNTER — Other Ambulatory Visit (HOSPITAL_BASED_OUTPATIENT_CLINIC_OR_DEPARTMENT_OTHER): Payer: Medicare Other | Admitting: Lab

## 2013-08-06 ENCOUNTER — Ambulatory Visit (HOSPITAL_BASED_OUTPATIENT_CLINIC_OR_DEPARTMENT_OTHER): Payer: Medicare Other | Admitting: Nurse Practitioner

## 2013-08-06 ENCOUNTER — Encounter: Payer: Medicare Other | Admitting: Nutrition

## 2013-08-06 ENCOUNTER — Telehealth: Payer: Self-pay | Admitting: Oncology

## 2013-08-06 VITALS — BP 110/62 | HR 98 | Temp 98.0°F | Resp 20 | Ht 64.0 in | Wt 116.0 lb

## 2013-08-06 VITALS — BP 99/63 | HR 98 | Temp 98.6°F | Resp 20 | Wt 117.5 lb

## 2013-08-06 DIAGNOSIS — R17 Unspecified jaundice: Secondary | ICD-10-CM

## 2013-08-06 DIAGNOSIS — C220 Liver cell carcinoma: Secondary | ICD-10-CM

## 2013-08-06 DIAGNOSIS — B1710 Acute hepatitis C without hepatic coma: Secondary | ICD-10-CM

## 2013-08-06 DIAGNOSIS — Z853 Personal history of malignant neoplasm of breast: Secondary | ICD-10-CM

## 2013-08-06 DIAGNOSIS — K7460 Unspecified cirrhosis of liver: Secondary | ICD-10-CM

## 2013-08-06 DIAGNOSIS — C228 Malignant neoplasm of liver, primary, unspecified as to type: Secondary | ICD-10-CM

## 2013-08-06 DIAGNOSIS — R110 Nausea: Secondary | ICD-10-CM

## 2013-08-06 DIAGNOSIS — B182 Chronic viral hepatitis C: Secondary | ICD-10-CM

## 2013-08-06 LAB — COMPREHENSIVE METABOLIC PANEL (CC13)
Albumin: 2.9 g/dL — ABNORMAL LOW (ref 3.5–5.0)
Anion Gap: 10 mEq/L (ref 3–11)
BUN: 4.3 mg/dL — ABNORMAL LOW (ref 7.0–26.0)
CO2: 27 mEq/L (ref 22–29)
Calcium: 8.2 mg/dL — ABNORMAL LOW (ref 8.4–10.4)
Chloride: 94 mEq/L — ABNORMAL LOW (ref 98–109)
Glucose: 142 mg/dl — ABNORMAL HIGH (ref 70–140)
Potassium: 3.5 mEq/L (ref 3.5–5.1)
Total Protein: 8.3 g/dL (ref 6.4–8.3)

## 2013-08-06 LAB — CBC WITH DIFFERENTIAL/PLATELET
Basophils Absolute: 0 10*3/uL (ref 0.0–0.1)
Eosinophils Absolute: 0.1 10*3/uL (ref 0.0–0.5)
HGB: 11.1 g/dL — ABNORMAL LOW (ref 11.6–15.9)
MCV: 101.2 fL — ABNORMAL HIGH (ref 79.5–101.0)
MONO#: 0.2 10*3/uL (ref 0.1–0.9)
MONO%: 5.8 % (ref 0.0–14.0)
NEUT#: 3.7 10*3/uL (ref 1.5–6.5)
NEUT%: 90 % — ABNORMAL HIGH (ref 38.4–76.8)
Platelets: 55 10*3/uL — ABNORMAL LOW (ref 145–400)
RDW: 16.8 % — ABNORMAL HIGH (ref 11.2–14.5)
WBC: 4.2 10*3/uL (ref 3.9–10.3)
lymph#: 0.1 10*3/uL — ABNORMAL LOW (ref 0.9–3.3)

## 2013-08-06 LAB — LACTATE DEHYDROGENASE (CC13): LDH: 162 U/L (ref 125–245)

## 2013-08-06 MED ORDER — LORAZEPAM 0.5 MG PO TABS: 0.5000 mg | ORAL_TABLET | Freq: Four times a day (QID) | ORAL | Status: DC | PRN

## 2013-08-06 NOTE — Progress Notes (Signed)
Patient cancelled nutrition appointment today.

## 2013-08-06 NOTE — Progress Notes (Signed)
Pt denies pain today but states she occasionally has pain on either side of her abdomen and in her stomach. She states her nausea has subsided, but she states she has not had normal BM x 3 days. She continues to take Imodium twice daily. Advised she stop Imodium for several days until she has normal BM. Pt is fatigued, loss of appetite.

## 2013-08-06 NOTE — Progress Notes (Signed)
[facility]    Radiation Oncology [address] [place]     Maryln Gottron, M.D. [place], [place] [address]               Billie Lade, M.D., Ph.D. Phone: [phone]      Molli Hazard A. Kathrynn Running, M.D. Fax: [phone]      Radene Gunning, M.D., Ph.D.         Lurline Hare, M.D.         Grayland Jack, M.D Weekly Treatment Management Note  Name: Jamie Diaz     MRN: [id number]        CSN: [id number] Date: 08/06/2013      DOB: August 19, 1961  CC: No PCP Per Patient         [place]    Status: Outpatient  Diagnosis: The encounter diagnosis was Hepatocellular carcinoma.  Current Dose: 26 Gy  Current Fraction: 13  Planned Dose: 50 Gy  Narrative: Ricki Miller Gammon was seen today for weekly treatment management. The chart was checked and CBCT  were reviewed. She seems to be less emotional this week. She has had some problems actually with constipation and will stop taking her Imodium on a daily basis. She also complains of some bloating recommended she use simethicone.  Ibuprofen  Current Outpatient Prescriptions  Medication Sig Dispense Refill  . ALPRAZolam (XANAX) 0.5 MG tablet Take 1 tablet (0.5 mg total) by mouth 3 (three) times daily as needed for sleep or anxiety.  90 tablet  0  . citalopram (CELEXA) 20 MG tablet Take 1 tablet (20 mg total) by mouth daily.  30 tablet  1  . hyaluronate sodium (RADIAPLEXRX) GEL Apply 1 application topically 2 (two) times daily.      Marland Kitchen loperamide (IMODIUM) 2 MG capsule Take 2 mg by mouth 4 (four) times daily as needed for diarrhea or loose stools.      Marland Kitchen LORazepam (ATIVAN) 0.5 MG tablet Take 1 tablet (0.5 mg total) by mouth every 6 (six) hours as needed for anxiety (for anxiety or nausea).  30 tablet  0  . magnesium oxide (MAG-OX) 400 (241.3 MG) MG tablet Take 1 tablet (400 mg total) by mouth 2 (two) times daily. For cramps  60 tablet  prn  . ondansetron (ZOFRAN-ODT) 4 MG disintegrating tablet 1 - 2 sublingual every 8 hours as needed for  nausea  30 tablet  prn  . SORAfenib (NEXAVAR) 200 MG tablet Take 400 mg by mouth 2 (two) times daily. Give on an empty stomach 1 hour before or 2 hours after meals.      . traMADol (ULTRAM) 50 MG tablet 1-2 tabs by mouth every 6 hours if needed for pain  100 tablet  1   No current facility-administered medications for this encounter.   Labs:  Lab Results  Component Value Date   WBC 4.2 08/06/2013   HGB 11.1* 08/06/2013   HCT 32.4* 08/06/2013   MCV 101.2* 08/06/2013   PLT 55* 08/06/2013   Lab Results  Component Value Date   CREATININE 0.8 08/06/2013   BUN 4.3* 08/06/2013   NA 131* 08/06/2013   K 3.5 08/06/2013   CO2 27 08/06/2013   Lab Results  Component Value Date   ALT 34 08/06/2013   AST 108* 08/06/2013   BILITOT 4.87* 08/06/2013    Physical Examination:  Filed Vitals:   08/06/13 1836  BP: 99/63  Pulse: 98  Temp: 98.6 F (37 C)  Resp: 20  Wt Readings from Last 3 Encounters:  08/06/13 117 lb 8 oz (53.298 kg)  08/06/13 116 lb (52.617 kg)  07/30/13 114 lb 14.4 oz (52.118 kg)     Lungs - Normal respiratory effort, chest expands symmetrically. Lungs are clear to auscultation, no crackles or wheezes.  Heart has regular rhythm and rate  Abdomen is soft and non tender , some distention. There is no fluid wave noted, bowel sounds are normal.  Assessment:  Patient tolerating treatments well  Plan: Continue treatment per original radiation prescription

## 2013-08-06 NOTE — Telephone Encounter (Signed)
gv and printed appt sched and avs for pt for DEC °

## 2013-08-06 NOTE — Progress Notes (Signed)
Patient cancelled nutrition appointment. 

## 2013-08-06 NOTE — Progress Notes (Signed)
OFFICE PROGRESS NOTE  Interval history:  Ms. 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 At the time of her initial visit with Dr. Cyndie Chime on 04/10/2013 the alpha-fetoprotein returned markedly elevated. Subsequent MRI scan and CT scan of the abdomen revealed periportal and peripancreatic malignant appearing lymphadenopathy. She was started on a trial of sorafenib pending further evaluation. She took the sorafenib for about 2 weeks and developed significant toxicity with diarrhea. She received intravenous hydration. The sorafenib was placed on hold. She was referred to Dr. Roselind Messier and. Disease appeared amenable to palliative radiation. She began the course of radiation on 07/22/2013.  She is seen today for scheduled followup. The diarrhea has resolved. She is now having small formed stools. She continues to have intermittent nausea. She is taking Zofran with partial relief. She is tolerating fluids without difficulty. The headaches she was previously experiencing are better. She thinks they may be due to the Zofran. Overall appetite is poor. She has pain at both sides which she relates to "dry heaves". She is having difficulty sleeping. She has tried Xanax which does not help.   Objective: Blood pressure 110/62, pulse 98, temperature 98 F (36.7 C), temperature source Oral, resp. rate 20, height 5\' 4"  (1.626 m), weight 116 lb (52.617 kg).  Oropharynx is without thrush or ulceration. No palpable cervical, supraclavicular or axillary lymph nodes. Lungs are clear. Regular cardiac rhythm. No murmur. Abdomen soft and nontender. Mildly distended. Bowel sounds active. No hepatomegaly. No mass. Extremities without edema. Motor strength 5 over 5. Knee DTRs 1+, symmetric. Finger to nose and rapid alternating movement intact. She is alert and oriented. PERRLA. No skin rash.  Lab Results: Lab Results   Component Value Date   WBC 4.2 08/06/2013   HGB 11.1* 08/06/2013   HCT 32.4* 08/06/2013   MCV 101.2* 08/06/2013   PLT 55* 08/06/2013    Chemistry:    Chemistry      Component Value Date/Time   NA 131* 08/06/2013 1049   K 3.5 08/06/2013 1049   CO2 27 08/06/2013 1049   BUN 4.3* 08/06/2013 1049   CREATININE 0.8 08/06/2013 1049      Component Value Date/Time   CALCIUM 8.2* 08/06/2013 1049   ALKPHOS 132 08/06/2013 1049   AST 108* 08/06/2013 1049   ALT 34 08/06/2013 1049   BILITOT 4.87* 08/06/2013 1049       Studies/Results: No results found.  Medications: I have reviewed the patient's current medications.  Assessment/Plan:  1. Metastatic recurrence of hepatocellular carcinoma currently completing a course of palliative radiation. Sorafenib continues to be on hold. 2. Diarrhea secondary to sorafenib. Resolved. 3. Intermittent nausea. Question etiology. Question related to radiation. She will try Ativan 0.5 mg every 6 hours as needed for nausea. She knows to contact the office if this is not effective. 4. Hyperbilirubinemia. Question related to radiation, question progressive disease involving the liver. We will repeat on 08/12/2013. 5. Stage II, T2 N0, ER negative, cancer of the left breast treated with mastectomy followed by chemotherapy. CA 27-29 was not elevated on 04/10/2013. 6. Hepatitis C.  Disposition-she continues radiation. She was instructed to continue to hold the sorafenib.  The etiology of the elevated bilirubin is unclear. We notified Dr. Roselind Messier of the bilirubin level. He is scheduled to see her later today and will render his opinion regarding whether or not the elevation could be related to radiation. We scheduled  her for repeat labs on 08/12/2013.  She will return for a followup visit with Dr. Cyndie Chime on 08/23/2013. She is scheduled to complete the course of radiation on 08/26/2013. She knows to contact the office in the interim with any  problems.   Plan reviewed with Dr. Cyndie Chime.  30 minutes were spent face to face at today's visit with the majority of that time involved in counseling/coordination of care.  Lonna Cobb ANP/GNP-BC

## 2013-08-06 NOTE — Patient Instructions (Addendum)
Take zofran 8 mg 1 hour prior to radiation and then every 8 hours as needed for nausea. Ativan 0.5 mg every 6 hours as needed for nausea (can swallow or dissolve under tongue).  Bilirubin level is more elevated. We are not sure why. We will check labs again next week.  DO NOT resume Sorafenib (Nexavar).

## 2013-08-07 ENCOUNTER — Telehealth: Payer: Self-pay | Admitting: *Deleted

## 2013-08-07 ENCOUNTER — Ambulatory Visit
Admission: RE | Admit: 2013-08-07 | Discharge: 2013-08-07 | Disposition: A | Discharge status: Home / Self Care | Payer: Medicare Other | Source: Ambulatory Visit | Attending: Radiation Oncology | Admitting: Radiation Oncology

## 2013-08-07 LAB — AFP TUMOR MARKER: AFP-Tumor Marker: 2965.4 ng/mL — ABNORMAL HIGH (ref 0.0–8.0)

## 2013-08-07 LAB — GAMMA GT: GGT: 530 U/L — ABNORMAL HIGH (ref 7–51)

## 2013-08-07 NOTE — Telephone Encounter (Signed)
Message copied by Sabino Snipes on Wed Aug 07, 2013  5:19 PM ------      Message from: Levert Feinstein      Created: Wed Aug 07, 2013  3:36 PM       Call pt liver tumor marker not much higher than 3 months ago so changes in liver tests likely from radiation Rxs.  I discussed her case with Dr Roselind Messier - we recommend continuing the radiation ------

## 2013-08-07 NOTE — Telephone Encounter (Signed)
The above message was given to pt per Dr Patsy Lager instructions.  She will return mon for radiations & instructed to discuss with nurse/Dr Kinard.

## 2013-08-09 ENCOUNTER — Other Ambulatory Visit: Payer: Self-pay | Admitting: *Deleted

## 2013-08-09 DIAGNOSIS — C50919 Malignant neoplasm of unspecified site of unspecified female breast: Secondary | ICD-10-CM

## 2013-08-09 MED ORDER — ALPRAZOLAM 0.5 MG PO TABS: 0.5000 mg | ORAL_TABLET | Freq: Three times a day (TID) | ORAL | Status: DC | PRN

## 2013-08-12 ENCOUNTER — Other Ambulatory Visit (HOSPITAL_BASED_OUTPATIENT_CLINIC_OR_DEPARTMENT_OTHER): Payer: Medicare Other

## 2013-08-12 ENCOUNTER — Ambulatory Visit
Admission: RE | Admit: 2013-08-12 | Discharge: 2013-08-12 | Disposition: A | Discharge status: Home / Self Care | Payer: Medicare Other | Source: Ambulatory Visit | Attending: Radiation Oncology | Admitting: Radiation Oncology

## 2013-08-12 DIAGNOSIS — C220 Liver cell carcinoma: Secondary | ICD-10-CM

## 2013-08-12 DIAGNOSIS — R110 Nausea: Secondary | ICD-10-CM

## 2013-08-12 DIAGNOSIS — C228 Malignant neoplasm of liver, primary, unspecified as to type: Secondary | ICD-10-CM

## 2013-08-12 LAB — CBC WITH DIFFERENTIAL/PLATELET
BASO%: 0.1 % (ref 0.0–2.0)
EOS%: 0.8 % (ref 0.0–7.0)
Eosinophils Absolute: 0.1 10*3/uL (ref 0.0–0.5)
HGB: 11.3 g/dL — ABNORMAL LOW (ref 11.6–15.9)
LYMPH%: 1.8 % — ABNORMAL LOW (ref 14.0–49.7)
MCH: 35.6 pg — ABNORMAL HIGH (ref 25.1–34.0)
MCHC: 33.6 g/dL (ref 31.5–36.0)
Platelets: 96 10*3/uL — ABNORMAL LOW (ref 145–400)
RBC: 3.17 10*6/uL — ABNORMAL LOW (ref 3.70–5.45)
RDW: 17 % — ABNORMAL HIGH (ref 11.2–14.5)
lymph#: 0.1 10*3/uL — ABNORMAL LOW (ref 0.9–3.3)

## 2013-08-12 LAB — COMPREHENSIVE METABOLIC PANEL (CC13)
AST: 102 U/L — ABNORMAL HIGH (ref 5–34)
Albumin: 2.8 g/dL — ABNORMAL LOW (ref 3.5–5.0)
BUN: 8.9 mg/dL (ref 7.0–26.0)
Calcium: 8.4 mg/dL (ref 8.4–10.4)
Chloride: 95 mEq/L — ABNORMAL LOW (ref 98–109)
Creatinine: 0.8 mg/dL (ref 0.6–1.1)
Glucose: 158 mg/dl — ABNORMAL HIGH (ref 70–140)
Potassium: 3.5 mEq/L (ref 3.5–5.1)

## 2013-08-13 ENCOUNTER — Ambulatory Visit
Admission: RE | Admit: 2013-08-13 | Discharge: 2013-08-13 | Disposition: A | Discharge status: Home / Self Care | Payer: Medicare Other | Source: Ambulatory Visit | Attending: Radiation Oncology | Admitting: Radiation Oncology

## 2013-08-13 ENCOUNTER — Encounter: Payer: Self-pay | Admitting: Radiation Oncology

## 2013-08-13 VITALS — BP 118/64 | HR 90 | Temp 98.1°F | Ht 64.0 in | Wt 115.2 lb

## 2013-08-13 DIAGNOSIS — C220 Liver cell carcinoma: Secondary | ICD-10-CM

## 2013-08-13 NOTE — Progress Notes (Signed)
[facility]    Radiation Oncology [address] [place]     Maryln Gottron, M.D. [place], [place] [address]               Billie Lade, M.D., Ph.D. Phone: [phone]      Molli Hazard A. Kathrynn Running, M.D. Fax: [phone]      Radene Gunning, M.D., Ph.D.         Lurline Hare, M.D.         Grayland Jack, M.D Weekly Treatment Management Note  Name: Jamie Diaz     MRN: [id number]        CSN: [id number] Date: 08/13/2013      DOB: Jan 14, 1961  CC: No PCP Per Patient         [place]    Status: Outpatient  Diagnosis: The encounter diagnosis was Hepatocellular carcinoma.  Current Dose: 32 Gy  Current Fraction: 16  Planned Dose: 50 Gy  Narrative: Jamie Diaz was seen today for weekly treatment management. The chart was checked and CBCT  were reviewed. She is feeling better this week with less problems with nausea and diarrhea. She actually has somewhat of appetite  Ibuprofen Current Outpatient Prescriptions  Medication Sig Dispense Refill  . ALPRAZolam (XANAX) 0.5 MG tablet Take 1 tablet (0.5 mg total) by mouth 3 (three) times daily as needed for sleep or anxiety.  90 tablet  0  . citalopram (CELEXA) 20 MG tablet Take 1 tablet (20 mg total) by mouth daily.  30 tablet  1  . hyaluronate sodium (RADIAPLEXRX) GEL Apply 1 application topically 2 (two) times daily.      Marland Kitchen loperamide (IMODIUM) 2 MG capsule Take 2 mg by mouth 4 (four) times daily as needed for diarrhea or loose stools.      Marland Kitchen LORazepam (ATIVAN) 0.5 MG tablet Take 1 tablet (0.5 mg total) by mouth every 6 (six) hours as needed for anxiety (for anxiety or nausea).  30 tablet  0  . magnesium oxide (MAG-OX) 400 (241.3 MG) MG tablet Take 1 tablet (400 mg total) by mouth 2 (two) times daily. For cramps  60 tablet  prn  . ondansetron (ZOFRAN-ODT) 4 MG disintegrating tablet 1 - 2 sublingual every 8 hours as needed for nausea  30 tablet  prn  . SORAfenib (NEXAVAR) 200 MG tablet Take 400 mg by mouth 2 (two) times daily. Give  on an empty stomach 1 hour before or 2 hours after meals.      . traMADol (ULTRAM) 50 MG tablet 1-2 tabs by mouth every 6 hours if needed for pain  100 tablet  1   No current facility-administered medications for this encounter.   Labs:  Lab Results  Component Value Date   WBC 7.8 08/12/2013   HGB 11.3* 08/12/2013   HCT 33.6* 08/12/2013   MCV 106.1* 08/12/2013   PLT 96* 08/12/2013   Lab Results  Component Value Date   CREATININE 0.8 08/12/2013   BUN 8.9 08/12/2013   NA 132* 08/12/2013   K 3.5 08/12/2013   CO2 26 08/12/2013   Lab Results  Component Value Date   ALT 32 08/12/2013   AST 102* 08/12/2013   BILITOT 5.74* 08/12/2013    Physical Examination:  Filed Vitals:   08/13/13 1842  BP: 118/64  Pulse: 90  Temp: 98.1 F (36.7 C)    Wt Readings from Last 3 Encounters:  08/13/13 115 lb 3.2 oz (52.254 kg)  08/06/13 117 lb 8 oz (53.298  kg)  08/06/13 116 lb (52.617 kg)     Lungs - Normal respiratory effort, chest expands symmetrically. Lungs are clear to auscultation, no crackles or wheezes.  Heart has regular rhythm and rate  Abdomen is soft and non tender with normal bowel sounds  Assessment:  Patient tolerating treatments well this week.  Plan: Continue treatment per original radiation prescription

## 2013-08-13 NOTE — Progress Notes (Signed)
Jamie Diaz has received 16 fractions to her abdomen.  She reports that she feel at home when she tripped and fell backwards and hit her head.  Note dried blood on posterior scalp, but not obvious swelling.  She does admit that it is sore.  She appears fatigued today.  She has restarted Imodium because she had ~ 6 small diarrhea.  Her vitals are stable.  She has lost 2.3 lbs since 08/06/13.

## 2013-08-14 ENCOUNTER — Ambulatory Visit
Admission: RE | Admit: 2013-08-14 | Discharge: 2013-08-14 | Disposition: A | Discharge status: Home / Self Care | Payer: Medicare Other | Source: Ambulatory Visit | Attending: Radiation Oncology | Admitting: Radiation Oncology

## 2013-08-15 ENCOUNTER — Other Ambulatory Visit: Payer: Self-pay | Admitting: Nurse Practitioner

## 2013-08-15 ENCOUNTER — Ambulatory Visit
Admission: RE | Admit: 2013-08-15 | Discharge: 2013-08-15 | Disposition: A | Discharge status: Home / Self Care | Payer: Medicare Other | Source: Ambulatory Visit | Attending: Radiation Oncology | Admitting: Radiation Oncology

## 2013-08-16 ENCOUNTER — Ambulatory Visit
Admission: RE | Admit: 2013-08-16 | Discharge: 2013-08-16 | Disposition: A | Discharge status: Home / Self Care | Payer: Medicare Other | Source: Ambulatory Visit | Attending: Radiation Oncology | Admitting: Radiation Oncology

## 2013-08-16 VITALS — BP 133/73 | HR 99 | Temp 98.3°F

## 2013-08-16 DIAGNOSIS — C220 Liver cell carcinoma: Secondary | ICD-10-CM

## 2013-08-16 MED ORDER — ONDANSETRON HCL 4 MG PO TABS
4.0000 mg | ORAL_TABLET | Freq: Once | ORAL | Status: AC
  Administered 2013-08-16: 4 mg via ORAL
  Filled 2013-08-16: qty 1

## 2013-08-16 NOTE — Progress Notes (Signed)
Earlean Polka zofran 4 mg po.  Escorted her to [facility] for treatment.

## 2013-08-16 NOTE — Progress Notes (Signed)
Checked on [place].  She is able to lay on the treatment table without nausea.

## 2013-08-16 NOTE — Progress Notes (Signed)
Cayden Rautio here today due to nausea.  She had run out of her zofran tablets yesterday and said that the pharmacy [facility]) did not have them ready yet.  She was OK last night and was able to eat dinner.  This morning she is nauseated and is wondering if she can get zofran before her treatment.   Notified Dr. Michell Heinrich.  Per Dr. Michell Heinrich, give her zofran 4 mg po.

## 2013-08-19 ENCOUNTER — Ambulatory Visit
Admission: RE | Admit: 2013-08-19 | Discharge: 2013-08-19 | Disposition: A | Discharge status: Home / Self Care | Payer: Medicare Other | Source: Ambulatory Visit | Attending: Radiation Oncology | Admitting: Radiation Oncology

## 2013-08-19 ENCOUNTER — Other Ambulatory Visit (HOSPITAL_BASED_OUTPATIENT_CLINIC_OR_DEPARTMENT_OTHER): Payer: Medicare Other

## 2013-08-19 DIAGNOSIS — C228 Malignant neoplasm of liver, primary, unspecified as to type: Secondary | ICD-10-CM

## 2013-08-19 DIAGNOSIS — R110 Nausea: Secondary | ICD-10-CM

## 2013-08-19 DIAGNOSIS — C220 Liver cell carcinoma: Secondary | ICD-10-CM

## 2013-08-19 LAB — CBC WITH DIFFERENTIAL/PLATELET
Basophils Absolute: 0.1 10*3/uL (ref 0.0–0.1)
EOS%: 2.2 % (ref 0.0–7.0)
Eosinophils Absolute: 0.2 10*3/uL (ref 0.0–0.5)
LYMPH%: 1.9 % — ABNORMAL LOW (ref 14.0–49.7)
MCH: 35.9 pg — ABNORMAL HIGH (ref 25.1–34.0)
MCHC: 33.5 g/dL (ref 31.5–36.0)
MCV: 107.3 fL — ABNORMAL HIGH (ref 79.5–101.0)
MONO#: 0.4 10*3/uL (ref 0.1–0.9)
MONO%: 3.9 % (ref 0.0–14.0)
Platelets: 101 10*3/uL — ABNORMAL LOW (ref 145–400)
RBC: 3.26 10*6/uL — ABNORMAL LOW (ref 3.70–5.45)
RDW: 16.7 % — ABNORMAL HIGH (ref 11.2–14.5)
WBC: 10.3 10*3/uL (ref 3.9–10.3)
lymph#: 0.2 10*3/uL — ABNORMAL LOW (ref 0.9–3.3)

## 2013-08-19 LAB — COMPREHENSIVE METABOLIC PANEL (CC13)
ALT: 44 U/L (ref 0–55)
AST: 155 U/L — ABNORMAL HIGH (ref 5–34)
Albumin: 2.8 g/dL — ABNORMAL LOW (ref 3.5–5.0)
Alkaline Phosphatase: 160 U/L — ABNORMAL HIGH (ref 40–150)
CO2: 23 mEq/L (ref 22–29)
Calcium: 8.1 mg/dL — ABNORMAL LOW (ref 8.4–10.4)
Chloride: 98 mEq/L (ref 98–109)
Potassium: 3.9 mEq/L (ref 3.5–5.1)
Sodium: 134 mEq/L — ABNORMAL LOW (ref 136–145)
Total Protein: 8.9 g/dL — ABNORMAL HIGH (ref 6.4–8.3)

## 2013-08-20 ENCOUNTER — Ambulatory Visit
Admission: RE | Admit: 2013-08-20 | Discharge: 2013-08-20 | Disposition: A | Discharge status: Home / Self Care | Payer: Medicare Other | Source: Ambulatory Visit | Attending: Radiation Oncology | Admitting: Radiation Oncology

## 2013-08-20 ENCOUNTER — Ambulatory Visit: Admission: RE | Admit: 2013-08-20 | Payer: Medicare Other | Source: Ambulatory Visit | Admitting: Radiation Oncology

## 2013-08-21 ENCOUNTER — Ambulatory Visit
Admission: RE | Admit: 2013-08-21 | Discharge: 2013-08-21 | Disposition: A | Discharge status: Home / Self Care | Payer: Medicare Other | Source: Ambulatory Visit | Attending: Radiation Oncology | Admitting: Radiation Oncology

## 2013-08-21 ENCOUNTER — Encounter: Payer: Self-pay | Admitting: Radiation Oncology

## 2013-08-21 VITALS — BP 120/82 | HR 108 | Temp 98.4°F | Resp 20 | Wt 114.4 lb

## 2013-08-21 DIAGNOSIS — C220 Liver cell carcinoma: Secondary | ICD-10-CM

## 2013-08-21 NOTE — Progress Notes (Signed)
[facility]    Radiation Oncology [address] [place]     Maryln Gottron, M.D. [place], [place] [address]               Billie Lade, M.D., Ph.D. Phone: [phone]      Molli Hazard A. Kathrynn Running, M.D. Fax: [phone]      Radene Gunning, M.D., Ph.D.         Lurline Hare, M.D.         Grayland Jack, M.D Weekly Treatment Management Note  Name: Jamie Diaz     MRN: [id number]        CSN: [id number] Date: 08/21/2013      DOB: Jul 14, 1961  CC: No PCP Per Patient         [place]    Status: Outpatient  Diagnosis: The encounter diagnosis was Hepatocellular carcinoma.  Current Dose: 44 Gy  Current Fraction: 22  Planned Dose: 50 Gy  Narrative: Jamie Diaz was seen today for weekly treatment management. The chart was checked and CBCT  were reviewed. She is doing reasonably well this week. She did have some nausea earlier today with some emesis but is feeling much better this evening. She has had some problems with constipation with hard stools and will start using a stool softener. She is in good spirits today  Ibuprofen Current Outpatient Prescriptions  Medication Sig Dispense Refill  . ALPRAZolam (XANAX) 0.5 MG tablet Take 1 tablet (0.5 mg total) by mouth 3 (three) times daily as needed for sleep or anxiety.  90 tablet  0  . citalopram (CELEXA) 20 MG tablet Take 1 tablet (20 mg total) by mouth daily.  30 tablet  1  . hyaluronate sodium (RADIAPLEXRX) GEL Apply 1 application topically 2 (two) times daily.      Marland Kitchen loperamide (IMODIUM) 2 MG capsule Take 2 mg by mouth 4 (four) times daily as needed for diarrhea or loose stools.      Marland Kitchen LORazepam (ATIVAN) 0.5 MG tablet TAKE 1 TABLET BY MOUTH EVERY 6 HOURS AS NEEDED FOR ANXIETY OR NAUSEA  30 tablet  0  . magnesium oxide (MAG-OX) 400 (241.3 MG) MG tablet Take 1 tablet (400 mg total) by mouth 2 (two) times daily. For cramps  60 tablet  prn  . ondansetron (ZOFRAN-ODT) 4 MG disintegrating tablet 1 - 2 sublingual every 8 hours as  needed for nausea  30 tablet  prn  . SORAfenib (NEXAVAR) 200 MG tablet Take 400 mg by mouth 2 (two) times daily. Give on an empty stomach 1 hour before or 2 hours after meals.      . traMADol (ULTRAM) 50 MG tablet 1-2 tabs by mouth every 6 hours if needed for pain  100 tablet  1   No current facility-administered medications for this encounter.   Labs:  Lab Results  Component Value Date   WBC 10.3 08/19/2013   HGB 11.7 08/19/2013   HCT 35.0 08/19/2013   MCV 107.3* 08/19/2013   PLT 101* 08/19/2013   Lab Results  Component Value Date   CREATININE 0.8 08/19/2013   BUN 3.6* 08/19/2013   NA 134* 08/19/2013   K 3.9 08/19/2013   CO2 23 08/19/2013   Lab Results  Component Value Date   ALT 44 08/19/2013   AST 155* 08/19/2013   BILITOT 5.22* 08/19/2013    Physical Examination:  Filed Vitals:   08/21/13 1816  BP: 120/82  Pulse: 108  Temp: 98.4 F (36.9 C)  Resp:  20    Wt Readings from Last 3 Encounters:  08/21/13 114 lb 6.4 oz (51.891 kg)  08/13/13 115 lb 3.2 oz (52.254 kg)  08/06/13 117 lb 8 oz (53.298 kg)     Lungs - Normal respiratory effort, chest expands symmetrically. Lungs are clear to auscultation, no crackles or wheezes.  Heart has regular rhythm and rate  Abdomen is soft and non tender with normal bowel sounds, some bloating no rebound or guarding  Assessment:  Patient tolerating treatments well  Plan: Continue treatment per original radiation prescription

## 2013-08-21 NOTE — Progress Notes (Signed)
Pt states she had dry heaves earlier today, has taken Zofran and Ativan. She states she took a nap and has been fine since. She has loss of appetite, fatigue. She states her BMs are hard, advised stool softener.

## 2013-08-22 ENCOUNTER — Ambulatory Visit
Admission: RE | Admit: 2013-08-22 | Discharge: 2013-08-22 | Disposition: A | Discharge status: Home / Self Care | Payer: Medicare Other | Source: Ambulatory Visit | Attending: Radiation Oncology | Admitting: Radiation Oncology

## 2013-08-23 ENCOUNTER — Ambulatory Visit (HOSPITAL_BASED_OUTPATIENT_CLINIC_OR_DEPARTMENT_OTHER): Payer: Medicare Other | Admitting: Oncology

## 2013-08-23 ENCOUNTER — Telehealth: Payer: Self-pay | Admitting: Oncology

## 2013-08-23 ENCOUNTER — Ambulatory Visit
Admission: RE | Admit: 2013-08-23 | Discharge: 2013-08-23 | Disposition: A | Discharge status: Home / Self Care | Payer: Medicare Other | Source: Ambulatory Visit | Attending: Radiation Oncology | Admitting: Radiation Oncology

## 2013-08-23 ENCOUNTER — Telehealth: Payer: Self-pay | Admitting: *Deleted

## 2013-08-23 VITALS — BP 113/68 | HR 106 | Temp 97.0°F | Resp 17 | Ht 64.0 in | Wt 114.8 lb

## 2013-08-23 DIAGNOSIS — G8929 Other chronic pain: Secondary | ICD-10-CM | POA: Diagnosis present

## 2013-08-23 DIAGNOSIS — Z79899 Other long term (current) drug therapy: Secondary | ICD-10-CM

## 2013-08-23 DIAGNOSIS — A498 Other bacterial infections of unspecified site: Secondary | ICD-10-CM | POA: Diagnosis present

## 2013-08-23 DIAGNOSIS — C50919 Malignant neoplasm of unspecified site of unspecified female breast: Secondary | ICD-10-CM

## 2013-08-23 DIAGNOSIS — C228 Malignant neoplasm of liver, primary, unspecified as to type: Secondary | ICD-10-CM

## 2013-08-23 DIAGNOSIS — E876 Hypokalemia: Secondary | ICD-10-CM | POA: Diagnosis present

## 2013-08-23 DIAGNOSIS — K7030 Alcoholic cirrhosis of liver without ascites: Secondary | ICD-10-CM | POA: Diagnosis present

## 2013-08-23 DIAGNOSIS — D630 Anemia in neoplastic disease: Secondary | ICD-10-CM | POA: Diagnosis present

## 2013-08-23 DIAGNOSIS — Z800 Family history of malignant neoplasm of digestive organs: Secondary | ICD-10-CM

## 2013-08-23 DIAGNOSIS — F1020 Alcohol dependence, uncomplicated: Secondary | ICD-10-CM | POA: Diagnosis present

## 2013-08-23 DIAGNOSIS — K5289 Other specified noninfective gastroenteritis and colitis: Secondary | ICD-10-CM | POA: Diagnosis present

## 2013-08-23 DIAGNOSIS — Z9221 Personal history of antineoplastic chemotherapy: Secondary | ICD-10-CM

## 2013-08-23 DIAGNOSIS — Z8249 Family history of ischemic heart disease and other diseases of the circulatory system: Secondary | ICD-10-CM

## 2013-08-23 DIAGNOSIS — C220 Liver cell carcinoma: Secondary | ICD-10-CM

## 2013-08-23 DIAGNOSIS — E875 Hyperkalemia: Secondary | ICD-10-CM | POA: Diagnosis not present

## 2013-08-23 DIAGNOSIS — D6959 Other secondary thrombocytopenia: Secondary | ICD-10-CM | POA: Diagnosis present

## 2013-08-23 DIAGNOSIS — B182 Chronic viral hepatitis C: Secondary | ICD-10-CM | POA: Diagnosis present

## 2013-08-23 DIAGNOSIS — E871 Hypo-osmolality and hyponatremia: Secondary | ICD-10-CM | POA: Diagnosis present

## 2013-08-23 DIAGNOSIS — Z66 Do not resuscitate: Secondary | ICD-10-CM | POA: Diagnosis not present

## 2013-08-23 DIAGNOSIS — A419 Sepsis, unspecified organism: Principal | ICD-10-CM | POA: Diagnosis present

## 2013-08-23 DIAGNOSIS — J189 Pneumonia, unspecified organism: Secondary | ICD-10-CM | POA: Diagnosis present

## 2013-08-23 DIAGNOSIS — C801 Malignant (primary) neoplasm, unspecified: Secondary | ICD-10-CM | POA: Diagnosis present

## 2013-08-23 DIAGNOSIS — C50912 Malignant neoplasm of unspecified site of left female breast: Secondary | ICD-10-CM

## 2013-08-23 DIAGNOSIS — R180 Malignant ascites: Secondary | ICD-10-CM | POA: Diagnosis present

## 2013-08-23 DIAGNOSIS — E43 Unspecified severe protein-calorie malnutrition: Secondary | ICD-10-CM | POA: Diagnosis present

## 2013-08-23 DIAGNOSIS — N390 Urinary tract infection, site not specified: Secondary | ICD-10-CM | POA: Diagnosis present

## 2013-08-23 DIAGNOSIS — D684 Acquired coagulation factor deficiency: Secondary | ICD-10-CM | POA: Diagnosis present

## 2013-08-23 DIAGNOSIS — Z9010 Acquired absence of unspecified breast and nipple: Secondary | ICD-10-CM

## 2013-08-23 DIAGNOSIS — R161 Splenomegaly, not elsewhere classified: Secondary | ICD-10-CM | POA: Diagnosis present

## 2013-08-23 DIAGNOSIS — Z515 Encounter for palliative care: Secondary | ICD-10-CM

## 2013-08-23 DIAGNOSIS — R627 Adult failure to thrive: Secondary | ICD-10-CM | POA: Diagnosis present

## 2013-08-23 DIAGNOSIS — Z8505 Personal history of malignant neoplasm of liver: Secondary | ICD-10-CM

## 2013-08-23 DIAGNOSIS — F419 Anxiety disorder, unspecified: Secondary | ICD-10-CM

## 2013-08-23 DIAGNOSIS — Y842 Radiological procedure and radiotherapy as the cause of abnormal reaction of the patient, or of later complication, without mention of misadventure at the time of the procedure: Secondary | ICD-10-CM | POA: Diagnosis present

## 2013-08-23 DIAGNOSIS — F17200 Nicotine dependence, unspecified, uncomplicated: Secondary | ICD-10-CM | POA: Diagnosis present

## 2013-08-23 DIAGNOSIS — Z853 Personal history of malignant neoplasm of breast: Secondary | ICD-10-CM

## 2013-08-23 NOTE — Telephone Encounter (Signed)
Called patient home left vm, and called mobile phone no answer, had 530 pm appt time,hasn't shown up as yet 5:41 PM

## 2013-08-23 NOTE — Telephone Encounter (Signed)
Gave pt appt for labs,ML,and MD for January , February 2015

## 2013-08-23 NOTE — Patient Instructions (Signed)
Resume sorafenib 200 mg  Two tablets Once a day instead of twice a day to start after Christmas

## 2013-08-23 NOTE — Progress Notes (Signed)
Hematology and Oncology Follow Up Visit  Jamie Diaz [id number] [birth year] 52 y.o. 08/23/2013 8:06 PM   Principle Diagnosis: Encounter Diagnoses  Name Primary?  . Hepatocellular carcinoma Yes  . Breast cancer, left   . Hepatitis C, chronic   . Anxiety      Interim History:   Followup visit for this 52 year old woman diagnosed with both invasive cancer of the left breast and initial stage I hepatocellular carcinoma in May 2011. Following neoadjuvant chemotherapy for the breast cancer she underwent a combined left modified radical mastectomy and partial hepatectomy. She also has underlying hepatitis C. Please see my new patient office evaluation 04/10/2013 for complete details. I. restaged her when she came to [facility] in late July of this year. She had a markedly elevated alpha-fetoprotein tumor marker. MRI showed bulky periportal malignant appearing lymphadenopathy with 2 questionable small lesions in her liver as well. I started her on sorafenib 400 mg twice daily and made a  radiation oncology referral to palliate the periportal adenopathy and avoid extrahepatic obstruction. She tolerated the sorafenib poorly with progressive weakness, and profuse diarrhea. The drug was discontinued. She proceeded with radiation treatments and will have her last treatment on Monday, December 15.  She has improved considerably since stopping the sorafenib. Her weight fell from 123 pounds down to 114 pounds but has now stabilized at 115 pounds. She is eating better. Diarrhea has resolved. She is having intermittent right upper quadrant abdominal pain.  There has been progressive liver dysfunction since starting the radiation bilirubin now up to 5.2.  Medications: reviewed  Allergies:  Allergies  Allergen Reactions  . Ibuprofen Hives and Palpitations    Review of Systems: Hematology: No bleeding or bruising ENT ROS: No sore throat Breast ROS: Respiratory ROS: No cough or dyspnea Cardiovascular  ROS:  No chest pain or palpitations Gastrointestinal ROS:   See above Genito-Urinary ROS: No urinary tract symptoms Musculoskeletal ROS: No bone pain Neurological ROS: No headache or change in vision. Positive insomnia. Anxiety. Dermatological ROS: No rash Remaining ROS negative.  Physical Exam: Blood pressure 113/68, pulse 106, temperature 97 F (36.1 C), temperature source Oral, resp. rate 17, height 5\' 4"  (1.626 m), weight 114 lb 12.8 oz (52.073 kg), SpO2 96.00%. Wt Readings from Last 3 Encounters:  08/23/13 114 lb 12.8 oz (52.073 kg)  08/21/13 114 lb 6.4 oz (51.891 kg)  08/13/13 115 lb 3.2 oz (52.254 kg)     General appearance: Thin Caucasian woman who is jaundiced HENNT: Pharynx no erythema, exudate, mass, or ulcer. Skin of the face with hypervascularity. No thyromegaly or thyroid nodules Lymph nodes: No cervical, supraclavicular, or axillary lymphadenopathy Breasts: Left mastectomy. No right breast masses. Lungs: Clear to auscultation, resonant to percussion throughout Heart: Regular rhythm, no murmur, no gallop, no rub, no click, no edema Abdomen: Soft, tender in the right upper quadrant, massively enlarged liver about 10 cm below right costal margin extending past the midline. No gross splenomegaly. Abdomen is protuberant. Positive fluid wave. Extremities: No edema, no calf tenderness Musculoskeletal: no joint deformities GU:  Vascular: Carotid pulses 2+, no bruits, distal pulses: Dorsalis pedis 1+ symmetric Neurologic: Alert, oriented, PERRLA, optic discs sharp and vessels normal, no hemorrhage or exudate, cranial nerves grossly normal, motor strength 5 over 5, reflexes 1+ symmetric, upper body coordination normal, gait normal, Skin: No rash or ecchymosis  Lab Results: CBC W/Diff    Component Value Date/Time   WBC 10.3 08/19/2013 1553   RBC 3.26* 08/19/2013 1553   HGB 11.7 08/19/2013  1553   HCT 35.0 08/19/2013 1553   PLT 101* 08/19/2013 1553   MCV 107.3* 08/19/2013 1553    MCH 35.9* 08/19/2013 1553   MCHC 33.5 08/19/2013 1553   RDW 16.7* 08/19/2013 1553   LYMPHSABS 0.2* 08/19/2013 1553   MONOABS 0.4 08/19/2013 1553   EOSABS 0.2 08/19/2013 1553   BASOSABS 0.1 08/19/2013 1553     Chemistry      Component Value Date/Time   NA 134* 08/19/2013 1553   K 3.9 08/19/2013 1553   CO2 23 08/19/2013 1553   BUN 3.6* 08/19/2013 1553   CREATININE 0.8 08/19/2013 1553      Component Value Date/Time   CALCIUM 8.1* 08/19/2013 1553   ALKPHOS 160* 08/19/2013 1553   AST 155* 08/19/2013 1553   ALT 44 08/19/2013 1553   BILITOT 5.22* 08/19/2013 1553      Impression:   #1. Metastatic recurrence of hepatocellular carcinoma  She is just completing palliative radiation. I like to get her back on the sorafenib at a 50% dose 400 mg daily but told her she could wait until after the Christmas holiday to begin this.  #2. Early sorafenib GI toxicity  Resolving off the drug. See discussion above   #3. Stage II, T2, N0, ER-negative, cancer left breast treated with mastectomy followed by chemotherapy. Breast cancer tumor marker not elevated.   #4. Hepatitis C.   #5. End-of-life issues I had an open discussion with her and her female friend today with respect to her guarded prognosis and what to expect going into the future. She told me that she has already signed a living will and left a copy  with our Child psychotherapist. She does not want her life prolonged by unnecessary or mechanical means in the event of an irreversible deterioration in her situation. I reassured her we would try to do everything we could to support her and treat reversible problems related to her cancer. She is very grateful for this discussion. I will establish NO CODE STATUS in her chart record.    CC: Patient Care Team: No Pcp Per Patient as PCP - General (General Practice) Levert Feinstein, MD as Consulting Physician (Oncology) Orland Penman, MD as Consulting Physician (Internal Medicine) Billie Lade, MD  as Consulting Physician (Radiation Oncology)   Levert Feinstein, MD 12/12/20148:06 PM

## 2013-08-25 ENCOUNTER — Other Ambulatory Visit: Payer: Self-pay | Admitting: Nurse Practitioner

## 2013-08-26 ENCOUNTER — Emergency Department (HOSPITAL_COMMUNITY): Payer: Medicare Other

## 2013-08-26 ENCOUNTER — Encounter (HOSPITAL_COMMUNITY): Payer: Self-pay | Admitting: Emergency Medicine

## 2013-08-26 ENCOUNTER — Ambulatory Visit
Admission: RE | Admit: 2013-08-26 | Discharge: 2013-08-26 | Disposition: A | Discharge status: Home / Self Care | Payer: Medicare Other | Source: Ambulatory Visit | Attending: Radiation Oncology | Admitting: Radiation Oncology

## 2013-08-26 ENCOUNTER — Inpatient Hospital Stay (HOSPITAL_COMMUNITY)
Admission: EM | Admit: 2013-08-26 | Discharge: 2013-08-30 | DRG: 871 | Disposition: A | Discharge status: Hospice | Payer: Medicare Other | Attending: Internal Medicine | Admitting: Internal Medicine

## 2013-08-26 DIAGNOSIS — R55 Syncope and collapse: Secondary | ICD-10-CM | POA: Diagnosis present

## 2013-08-26 DIAGNOSIS — K5289 Other specified noninfective gastroenteritis and colitis: Secondary | ICD-10-CM

## 2013-08-26 DIAGNOSIS — E876 Hypokalemia: Secondary | ICD-10-CM | POA: Diagnosis present

## 2013-08-26 DIAGNOSIS — E860 Dehydration: Secondary | ICD-10-CM | POA: Diagnosis present

## 2013-08-26 DIAGNOSIS — IMO0002 Reserved for concepts with insufficient information to code with codable children: Secondary | ICD-10-CM | POA: Diagnosis present

## 2013-08-26 DIAGNOSIS — N390 Urinary tract infection, site not specified: Secondary | ICD-10-CM | POA: Diagnosis present

## 2013-08-26 DIAGNOSIS — C220 Liver cell carcinoma: Secondary | ICD-10-CM | POA: Diagnosis present

## 2013-08-26 DIAGNOSIS — E871 Hypo-osmolality and hyponatremia: Secondary | ICD-10-CM

## 2013-08-26 DIAGNOSIS — R188 Other ascites: Secondary | ICD-10-CM | POA: Diagnosis present

## 2013-08-26 DIAGNOSIS — K529 Noninfective gastroenteritis and colitis, unspecified: Secondary | ICD-10-CM | POA: Diagnosis present

## 2013-08-26 DIAGNOSIS — D696 Thrombocytopenia, unspecified: Secondary | ICD-10-CM | POA: Diagnosis present

## 2013-08-26 DIAGNOSIS — K7460 Unspecified cirrhosis of liver: Secondary | ICD-10-CM | POA: Diagnosis present

## 2013-08-26 DIAGNOSIS — E43 Unspecified severe protein-calorie malnutrition: Secondary | ICD-10-CM | POA: Insufficient documentation

## 2013-08-26 DIAGNOSIS — D649 Anemia, unspecified: Secondary | ICD-10-CM | POA: Diagnosis present

## 2013-08-26 DIAGNOSIS — I951 Orthostatic hypotension: Secondary | ICD-10-CM

## 2013-08-26 DIAGNOSIS — C50919 Malignant neoplasm of unspecified site of unspecified female breast: Secondary | ICD-10-CM | POA: Diagnosis present

## 2013-08-26 DIAGNOSIS — A419 Sepsis, unspecified organism: Principal | ICD-10-CM | POA: Diagnosis present

## 2013-08-26 DIAGNOSIS — B182 Chronic viral hepatitis C: Secondary | ICD-10-CM | POA: Diagnosis present

## 2013-08-26 DIAGNOSIS — I959 Hypotension, unspecified: Secondary | ICD-10-CM | POA: Diagnosis present

## 2013-08-26 DIAGNOSIS — G8929 Other chronic pain: Secondary | ICD-10-CM | POA: Diagnosis present

## 2013-08-26 DIAGNOSIS — J189 Pneumonia, unspecified organism: Secondary | ICD-10-CM | POA: Diagnosis present

## 2013-08-26 LAB — OCCULT BLOOD, POC DEVICE: Fecal Occult Bld: NEGATIVE

## 2013-08-26 LAB — CBC
HCT: 24.7 % — ABNORMAL LOW (ref 36.0–46.0)
MCH: 36.2 pg — ABNORMAL HIGH (ref 26.0–34.0)
MCHC: 35.6 g/dL (ref 30.0–36.0)
RBC: 2.43 MIL/uL — ABNORMAL LOW (ref 3.87–5.11)
RDW: 15.9 % — ABNORMAL HIGH (ref 11.5–15.5)
WBC: 6.9 10*3/uL (ref 4.0–10.5)

## 2013-08-26 LAB — URINALYSIS, ROUTINE W REFLEX MICROSCOPIC
Glucose, UA: NEGATIVE mg/dL
Ketones, ur: 15 mg/dL — AB
Nitrite: POSITIVE — AB
Protein, ur: NEGATIVE mg/dL
pH: 5 (ref 5.0–8.0)

## 2013-08-26 LAB — POCT I-STAT, CHEM 8
BUN: 11 mg/dL (ref 6–23)
Calcium, Ion: 1.08 mmol/L — ABNORMAL LOW (ref 1.12–1.23)
Chloride: 88 mEq/L — ABNORMAL LOW (ref 96–112)
Creatinine, Ser: 1.2 mg/dL — ABNORMAL HIGH (ref 0.50–1.10)
HCT: 29 % — ABNORMAL LOW (ref 36.0–46.0)
Sodium: 129 mEq/L — ABNORMAL LOW (ref 135–145)
TCO2: 22 mmol/L (ref 0–100)

## 2013-08-26 LAB — COMPREHENSIVE METABOLIC PANEL
ALT: 43 U/L — ABNORMAL HIGH (ref 0–35)
AST: 127 U/L — ABNORMAL HIGH (ref 0–37)
Albumin: 2.4 g/dL — ABNORMAL LOW (ref 3.5–5.2)
Alkaline Phosphatase: 132 U/L — ABNORMAL HIGH (ref 39–117)
BUN: 12 mg/dL (ref 6–23)
Chloride: 86 mEq/L — ABNORMAL LOW (ref 96–112)
GFR calc Af Amer: 80 mL/min — ABNORMAL LOW (ref >90)
Glucose, Bld: 108 mg/dL — ABNORMAL HIGH (ref 70–99)
Potassium: 3.2 mEq/L — ABNORMAL LOW (ref 3.5–5.1)
Sodium: 123 mEq/L — ABNORMAL LOW (ref 135–145)
Total Bilirubin: 15.2 mg/dL — ABNORMAL HIGH (ref 0.3–1.2)
Total Protein: 7.6 g/dL (ref 6.0–8.3)

## 2013-08-26 LAB — LIPASE, BLOOD: Lipase: 13 U/L (ref 11–59)

## 2013-08-26 LAB — CG4 I-STAT (LACTIC ACID): Lactic Acid, Venous: 4.6 mmol/L — ABNORMAL HIGH (ref 0.5–2.2)

## 2013-08-26 LAB — URINE MICROSCOPIC-ADD ON

## 2013-08-26 MED ORDER — MORPHINE SULFATE 4 MG/ML IJ SOLN
4.0000 mg | Freq: Once | INTRAMUSCULAR | Status: AC
Start: 2013-08-26 — End: 2013-08-26
  Administered 2013-08-26: 4 mg via INTRAVENOUS
  Filled 2013-08-26: qty 1

## 2013-08-26 MED ORDER — SODIUM CHLORIDE 0.9 % IV BOLUS (SEPSIS)
1000.0000 mL | Freq: Once | INTRAVENOUS | Status: AC
  Administered 2013-08-26: 1000 mL via INTRAVENOUS

## 2013-08-26 MED ORDER — IOHEXOL 300 MG/ML  SOLN
100.0000 mL | Freq: Once | INTRAMUSCULAR | Status: AC | PRN
  Administered 2013-08-26: 100 mL via INTRAVENOUS

## 2013-08-26 MED ORDER — ONDANSETRON HCL 4 MG/2ML IJ SOLN
4.0000 mg | Freq: Once | INTRAMUSCULAR | Status: AC
  Administered 2013-08-26: 4 mg via INTRAVENOUS
  Filled 2013-08-26: qty 2

## 2013-08-26 MED ORDER — ONDANSETRON HCL 4 MG PO TABS
4.0000 mg | ORAL_TABLET | Freq: Four times a day (QID) | ORAL | Status: DC | PRN
  Administered 2013-08-27: 4 mg via ORAL
  Filled 2013-08-26: qty 1

## 2013-08-26 MED ORDER — HYDROMORPHONE HCL PF 1 MG/ML IJ SOLN
0.5000 mg | INTRAMUSCULAR | Status: DC | PRN
  Administered 2013-08-27 – 2013-08-28 (×7): 0.5 mg via INTRAVENOUS
  Filled 2013-08-26 (×8): qty 1

## 2013-08-26 MED ORDER — POTASSIUM CHLORIDE CRYS ER 20 MEQ PO TBCR
40.0000 meq | EXTENDED_RELEASE_TABLET | Freq: Once | ORAL | Status: AC
  Administered 2013-08-26: 40 meq via ORAL
  Filled 2013-08-26: qty 2

## 2013-08-26 MED ORDER — IOHEXOL 300 MG/ML  SOLN
50.0000 mL | Freq: Once | INTRAMUSCULAR | Status: AC | PRN
  Administered 2013-08-26: 50 mL via ORAL

## 2013-08-26 MED ORDER — ACETAMINOPHEN 650 MG RE SUPP: 650.0000 mg | Freq: Four times a day (QID) | RECTAL | Status: DC | PRN

## 2013-08-26 MED ORDER — POTASSIUM CHLORIDE IN NACL 40-0.9 MEQ/L-% IV SOLN
INTRAVENOUS | Status: AC
  Administered 2013-08-27 (×2): via INTRAVENOUS
  Filled 2013-08-26 (×2): qty 1000

## 2013-08-26 MED ORDER — SODIUM CHLORIDE 0.9 % IJ SOLN: 3.0000 mL | Freq: Two times a day (BID) | INTRAMUSCULAR | Status: DC

## 2013-08-26 MED ORDER — ACETAMINOPHEN 325 MG PO TABS: 650.0000 mg | ORAL_TABLET | Freq: Four times a day (QID) | ORAL | Status: DC | PRN

## 2013-08-26 MED ORDER — CIPROFLOXACIN IN D5W 400 MG/200ML IV SOLN
400.0000 mg | Freq: Once | INTRAVENOUS | Status: AC
  Administered 2013-08-26: 400 mg via INTRAVENOUS
  Filled 2013-08-26: qty 200

## 2013-08-26 MED ORDER — CIPROFLOXACIN IN D5W 400 MG/200ML IV SOLN
400.0000 mg | Freq: Two times a day (BID) | INTRAVENOUS | Status: DC
  Filled 2013-08-26: qty 200

## 2013-08-26 MED ORDER — METRONIDAZOLE IN NACL 5-0.79 MG/ML-% IV SOLN
500.0000 mg | Freq: Once | INTRAVENOUS | Status: DC
  Filled 2013-08-26: qty 100

## 2013-08-26 MED ORDER — ALBUTEROL SULFATE (5 MG/ML) 0.5% IN NEBU: 2.5000 mg | INHALATION_SOLUTION | RESPIRATORY_TRACT | Status: DC | PRN

## 2013-08-26 MED ORDER — ONDANSETRON HCL 4 MG/2ML IJ SOLN
4.0000 mg | Freq: Four times a day (QID) | INTRAMUSCULAR | Status: DC | PRN
  Administered 2013-08-27 – 2013-08-30 (×10): 4 mg via INTRAVENOUS
  Filled 2013-08-26 (×12): qty 2

## 2013-08-26 MED ORDER — ALPRAZOLAM 0.5 MG PO TABS
0.5000 mg | ORAL_TABLET | Freq: Three times a day (TID) | ORAL | Status: DC | PRN
  Administered 2013-08-27 – 2013-08-30 (×3): 0.5 mg via ORAL
  Filled 2013-08-26 (×3): qty 1

## 2013-08-26 MED ORDER — METRONIDAZOLE IN NACL 5-0.79 MG/ML-% IV SOLN
500.0000 mg | Freq: Three times a day (TID) | INTRAVENOUS | Status: DC
  Filled 2013-08-26: qty 100

## 2013-08-26 NOTE — H&P (Addendum)
[facility]  History and Physical  CHESNEY SUARES ZOX:[id number] DOB: 05/27/1961 DOA: 08/26/2013  Referring physician: EDP PCP: No PCP Per Patient  Outpatient Specialists:  1. Medical Oncology: Dr. Cephas Darby 2. Radiation Oncology: Dr. Roselind Messier  Chief Complaint: Nausea, diarrhea, weakness  HPI: Jamie Diaz is a 52 y.o. female with history of metastatic recurrent hepatocellular carcinoma, breast cancer, chronic hepatitis C, cirrhosis, ongoing tobacco abuse, alcohol use, chemotherapy/sorafenib on hold secondary to GI toxicity and completed the last dose of radiation treatment today, referred from radiation oncology do to nausea, dry heaving, diarrhea, poor oral intake, dizziness, jaundice and orthostatic hypotension. Patient gives a long-standing history of intermittent diarrhea ongoing since start of chemotherapy, intermittently decreases after taking antimotility medications, had decreased for a couple of days and then started back today. She is very poor oral intake and has been having nausea and dry heaving. She also complains of intermittent abdominal pain. She denies fever or chills. Denies recent antibiotic use. At the radiation department, she complained of dizziness and felt like she was almost going to pass out. She denies dysuria or urinary frequency, cough, chest pain or palpitations. She does give history of intermittent abdominal distention. In the ED, sodium 129, potassium 3.2, abnormal LFTs with total bilirubin of 15, albumin 2.4 and hemoglobin of 8.8 (11.7 on 08/19/13). Patient states that she may have seen a streak of blood in the stools. Chest x-ray suggests new right-sided airspace disease concerning for pneumonia, CT abdomen with cirrhotic liver, splenomegaly findings suggestive of colitis. Hospitalist admission requested.  Review of Systems: All systems reviewed and apart from history of presenting illness, are negative.  Past Medical History  Diagnosis Date  . Cancer    . Hepatitis C, chronic 04/10/2013    Dx 2011  . Hepatocellular carcinoma 04/18/2013  . Breast cancer 2011    breast ca   Past Surgical History  Procedure Laterality Date  . Appendectomy    . Mastectomy, radical Left 07/26/2010  . Open partial hepatectomy [83]  07/26/2010  . Port-a-cath removal  03/2010  . Port-a-cath removal  10/2011    piece left in that had to be removed   Social History:  reports that she has been smoking Cigarettes.  She started smoking about 40 years ago. She has been smoking about 0.50 packs per day. She does not have any smokeless tobacco history on file. She reports that she drinks about 10.5 ounces of alcohol per week. She reports that she does not use illicit drugs. Single. Lives with her sister. Needs assistance to ambulate.  Allergies  Allergen Reactions  . Ibuprofen Hives and Palpitations    Family History  Problem Relation Age of Onset  . Colon cancer Mother   . Heart disease Father     Prior to Admission medications   Medication Sig Start Date End Date Taking? Authorizing Provider  ALPRAZolam Prudy Feeler) 0.5 MG tablet Take 1 tablet (0.5 mg total) by mouth 3 (three) times daily as needed for sleep or anxiety. 08/09/13  Yes Levert Feinstein, MD  citalopram (CELEXA) 20 MG tablet Take 1 tablet (20 mg total) by mouth daily. 05/27/13  Yes Levert Feinstein, MD  hyaluronate sodium (RADIAPLEXRX) GEL Apply 1 application topically 2 (two) times daily.   Yes Historical Provider, MD  loperamide (IMODIUM) 2 MG capsule Take 2 mg by mouth 4 (four) times daily as needed for diarrhea or loose stools.   Yes Historical Provider, MD  LORazepam (ATIVAN) 0.5 MG tablet Take 1 tablet (  0.5 mg total) by mouth every 6 (six) hours as needed for anxiety. DO NOT USE WITH Prudy Feeler- 08/25/13  Yes Levert Feinstein, MD  ondansetron (ZOFRAN-ODT) 4 MG disintegrating tablet 1 - 2 sublingual every 8 hours as needed for nausea 06/14/13  Yes Levert Feinstein, MD  SORAfenib (NEXAVAR) 200  MG tablet Take 400 mg by mouth 2 (two) times daily. Give on an empty stomach 1 hour before or 2 hours after meals.    Historical Provider, MD   Physical Exam: Filed Vitals:   08/26/13 1913 08/26/13 2059 08/26/13 2105  BP: 110/64  107/61  Pulse: 97  99  Temp: 98.3 F (36.8 C) 99.1 F (37.3 C)   TempSrc: Oral Rectal   Resp:   18  SpO2: 97%  95%     General exam: Moderately built and cachectic, chronically ill-looking female patient, lying comfortably supine on the gurney in no obvious distress. Scleral icterus  Head, eyes and ENT: Nontraumatic and normocephalic. Pupils equally reacting to light and accommodation. Oral mucosa dry.  Neck: Supple. No JVD, carotid bruit or thyromegaly.  Lymphatics: No lymphadenopathy.  Respiratory system: Slightly reduced breath sounds in the bases but otherwise clear to auscultation. No increased work of breathing.  Cardiovascular system: S1 and S2 heard, RRR. No JVD, murmurs, gallops, clicks or pedal edema.  Gastrointestinal system: Abdomen is mildly distended but soft and nontender. Normal bowel sounds heard. No organomegaly or masses appreciated.  Central nervous system: Alert and oriented. No focal neurological deficits.  Extremities: Symmetric 5 x 5 power. Peripheral pulses symmetrically felt.   Skin: No rashes or acute findings. Icterus.  Musculoskeletal system: Negative exam.  Psychiatry: Pleasant and cooperative.   Labs on Admission:  Basic Metabolic Panel:  Recent Labs Lab 08/26/13 1955 08/26/13 2006  NA 123* 129*  K 3.2* 3.3*  CL 86* 88*  CO2 24  --   GLUCOSE 108* 104*  BUN 12 11  CREATININE 0.92 1.20*  CALCIUM 8.2*  --    Liver Function Tests:  Recent Labs Lab 08/26/13 1955  AST 127*  ALT 43*  ALKPHOS 132*  BILITOT 15.2*  PROT 7.6  ALBUMIN 2.4*    Recent Labs Lab 08/26/13 1955  LIPASE 13   No results found for this basename: AMMONIA,  in the last 168 hours CBC:  Recent Labs Lab 08/26/13 1955  08/26/13 2006  WBC 6.9  --   HGB 8.8* 9.9*  HCT 24.7* 29.0*  MCV 101.6*  --   PLT PLATELET CLUMPS NOTED ON SMEAR  --    Cardiac Enzymes: No results found for this basename: CKTOTAL, CKMB, CKMBINDEX, TROPONINI,  in the last 168 hours  BNP (last 3 results) No results found for this basename: PROBNP,  in the last 8760 hours CBG: No results found for this basename: GLUCAP,  in the last 168 hours  Radiological Exams on Admission: Dg Chest 2 View  08/26/2013   CLINICAL DATA:  Hypotension.  History of breast cancer and jaundice  EXAM: CHEST  2 VIEW  COMPARISON:  04/18/2013  FINDINGS: Patchy lung opacities on the right. Lung volumes are low with basilar atelectatic change. Normal heart size. No acute osseous findings. Status post left mastectomy and axillary dissection.  IMPRESSION: New right-sided airspace disease, primarily concerning for pneumonia. Recommend followup to clearing.   Electronically Signed   By: Tiburcio Pea M.D.   On: 08/26/2013 23:36   Ct Abdomen Pelvis W Contrast  08/26/2013   CLINICAL DATA:  New onset jaundice,  history cirrhosis, hepatocellular carcinoma, chronic hepatitis-C, breast cancer  EXAM: CT ABDOMEN AND PELVIS WITH CONTRAST  TECHNIQUE: Multidetector CT imaging of the abdomen and pelvis was performed using the standard protocol following bolus administration of intravenous contrast. Sagittal and coronal MPR images reconstructed from axial data set.  CONTRAST:  50mL OMNIPAQUE IOHEXOL 300 MG/ML SOLN orally, OMNIPAQUE IOHEXOL 300 MG/ML SOLN IV  COMPARISON:  04/23/2013  FINDINGS: Bibasilar atelectasis.  Innumerable tiny low-attenuation foci identified throughtout a cirrhotic liver, new since previous study, question related to cirrhosis or hepatocellular carcinoma though peliosis hepatis can cause a similar appearance and has been described in patients with hepatocellular carcinoma.  Splenic enlargement, 7.5 x 10.8 x 14.9 cm.  Significant ascites.  No focal  abnormalities of the spleen, pancreas, kidneys, or adrenal glands.  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  Enlarged periportal lymph node 2.6 x 2.3 cm, previously 2.2 x 1.9 cm.  Additional enlarged periportal lymph node is indistinguishable from the adjacent caudate lobe on today's exam, approximately 4.5 x 3.2 cm, previously 4.2 x 3.2 cm.  Perigastric and periesophageal varices/collaterals.  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  Significant wall thickening of the ascending colon and hepatic flexure compatible with colitis.  Stomach and remaining bowel loops grossly unremarkable.  Unremarkable bladder, uterus, and adnexae.  Scattered mesenteric edema.  No hernia or acute osseous findings.  IMPRESSION: Cirrhotic liver with splenomegaly, varices/collaterals and new ascites.  Interim innumerable tiny new low-attenuation foci throughout the liver, could be related to cirrhosis, hepatocellular carcinoma or peliosis hepatis.  Consider MR imaging with and without contrast for further evaluation.  Slight interval increase in size of periportal adenopathy.  Wall thickening of the hepatic flexure and ascending colon consistent with colitis; differential diagnosis includes infection, inflammatory bowel disease and ischemia.   Electronically Signed   By: Ulyses Southward M.D.   On: 08/26/2013 21:27    Assessment/Plan Principal Problem:   Colitis Active Problems:   Breast cancer   Cirrhosis of liver   Chronic pain   Hepatitis C, chronic   Hepatocellular carcinoma   Ascites   SIRS (systemic inflammatory response syndrome)   UTI (lower urinary tract infection)   Dehydration with hyponatremia   Hypokalemia   Anemia   Thrombocytopenia   Failure to thrive   Hypotension   Near syncope   Colitis of hepatic flexure and ascending colon  - DD: Infectious, inflammation, ischemic, side effect of chemotherapy medication - Admit to step down unit. Contact isolation.  C. difficile PCR. GI pathogen panel PCR - Given pneumonia, will cover with IV Zosyn.  Healthcare acquired pneumonia  - IV vancomycin and Zosyn   UTI - IV Zosyn pending urine culture results  Sepsis - Secondary to colitis, pneumonia and UTI - Aggressive IV fluid hydration and antibiotic management as above. Monitor closely in step down unit.  Dehydration with hyponatremia - Secondary to sepsis, diarrhea and poor oral intake. IV normal saline.  Hypokalemia - Replete and follow BMP.  Anemia - No history of significant bleeding but hemoglobin has dropped from 11-8.8. Follow CBC closely and transfuse if hemoglobin drops less than 8 g per DL.  Chronic thrombocytopenia Follow CBC.  Hypotension/near syncope - IV fluids   Chronic hep C/cirrhosis/hepatocellular carcinoma - Will alert medical oncologist of patient's admission. Addendum - Significant worsening of LFT's/bilirubin compared to recently: ? Worsening mets Vs radiation injury Vs other causes. - Monitor CMP and Mx per oncology.  History of breast cancer  Code Status: Full code. Recently had been seen by medical oncologist and was DO NOT RESUSCITATE at that time.   Family Communication: Discussed with sister and ex-spouse at bedside.   Disposition Plan: Admit to step down. To be determined    Time spent: 70 minutes   Mavin Dyke, MD, FACP, FHM. [facility] Pager [phone]  If 7PM-7AM, please contact night-coverage [email or web address] Password [facility] 08/26/2013, 11:54 PM

## 2013-08-26 NOTE — ED Notes (Signed)
Pt is in ct will complete orders when pt returns .Marland Kitchen

## 2013-08-26 NOTE — Progress Notes (Signed)
Brought Crista to the ER on a stretcher with Victorino Dike, Systems developer.  Gave report to Abby, RN in ER.  Escorted Karlisa's family in to see her.

## 2013-08-26 NOTE — ED Notes (Signed)
Pt states that she has not been able to eat for last 4 days and oncology nurses states that she looks worse than she did last week. Receiving radiation tx for cancer that she states in lymph node near pancreas and liver. Pt was hypotensive w/ systolic in 80s when she sat up during orthostatic vitals. Pt is also jaundiced which they stated was a new development worse in the last week. Alert and oriented.

## 2013-08-26 NOTE — ED Notes (Signed)
Critical I stat Lactic - Dr Gwendolyn Grant aware.

## 2013-08-26 NOTE — ED Provider Notes (Signed)
CSN: [id number]     Arrival date & time 08/26/13  1910 History   First MD Initiated Contact with Patient 08/26/13 1914     Chief Complaint  Patient presents with  . Hypotension  . Jaundice  . Cancer   (Consider location/radiation/quality/duration/timing/severity/associated sxs/prior Treatment) HPI Comments: Pt is a 52 y/o female with a PMHx of HCC, hepatitis C and breast CA in remission who presents to the ED complaining of weakness, lightheadedness and overall not feeling well x 4 days. She has not been able to eat for the past 4 days, was told today by the oncology nurse while she was receiving her last round of radiation that she is looking worse today than last week. Today was her last radiation treatment for [facility]. Was on chemo prior, however did not agree with her body and another attempt at a different dose may be tried soon. While sitting in the chair at the cancer center she began to feel lightheaded, dizzy, had a spinning sensation and nauseated. On arrival to the ED it is noted that she had orthostatic hypotension, systolic blood pressure dropping from 110 down to 83 on standing. She has had very little to eat over the past 4 days, sister noticed her eyes became yellow about 3 days ago, worsening into today. She's had slight abdominal distention, mild pain and dry heaving. She is scheduled for a repeat CT scan on January 6, last CT scan was in August. Denies fever, night sweats, chest pain, sob, syncope, back pain. Oncologist Dr. Cyndie Chime.  The history is provided by the patient and a relative.    Past Medical History  Diagnosis Date  . Cancer   . Hepatitis C, chronic 04/10/2013    Dx 2011  . Hepatocellular carcinoma 04/18/2013  . Breast cancer 2011    breast ca   Past Surgical History  Procedure Laterality Date  . Appendectomy    . Mastectomy, radical Left 07/26/2010  . Open partial hepatectomy [83]  07/26/2010  . Port-a-cath removal  03/2010  . Port-a-cath removal  10/2011   piece left in that had to be removed   Family History  Problem Relation Age of Onset  . Colon cancer Mother   . Heart disease Father    History  Substance Use Topics  . Smoking status: Current Every Day Smoker -- 0.50 packs/day    Types: Cigarettes    Start date: 07/04/1973  . Smokeless tobacco: Not on file  . Alcohol Use: 10.5 oz/week    21 drink(s) per week   OB History   Grav Para Term Preterm Abortions TAB SAB Ect Mult Living                 Review of Systems  Constitutional: Positive for appetite change and fatigue.  Gastrointestinal: Positive for nausea, vomiting and abdominal pain.  Skin: Positive for color change.  Neurological: Positive for dizziness, weakness and light-headedness.  All other systems reviewed and are negative.    Allergies  Ibuprofen  Home Medications   Current Outpatient Rx  Name  Route  Sig  Dispense  Refill  . ALPRAZolam (XANAX) 0.5 MG tablet   Oral   Take 1 tablet (0.5 mg total) by mouth 3 (three) times daily as needed for sleep or anxiety.   90 tablet   0     CALLED TO PHARMACY.   . citalopram (CELEXA) 20 MG tablet   Oral   Take 1 tablet (20 mg total) by mouth daily.  30 tablet   1     This script & one refill & pt should have filled b ...   . hyaluronate sodium (RADIAPLEXRX) GEL   Topical   Apply 1 application topically 2 (two) times daily.         Marland Kitchen loperamide (IMODIUM) 2 MG capsule   Oral   Take 2 mg by mouth 4 (four) times daily as needed for diarrhea or loose stools.         Marland Kitchen LORazepam (ATIVAN) 0.5 MG tablet   Oral   Take 1 tablet (0.5 mg total) by mouth every 6 (six) hours as needed for anxiety. DO NOT USE WITH XANAX-   60 tablet   0   . ondansetron (ZOFRAN-ODT) 4 MG disintegrating tablet      1 - 2 sublingual every 8 hours as needed for nausea   30 tablet   prn   . SORAfenib (NEXAVAR) 200 MG tablet   Oral   Take 400 mg by mouth 2 (two) times daily. Give on an empty stomach 1 hour before or 2 hours  after meals.          BP 107/61  Pulse 99  Temp(Src) 99.1 F (37.3 C) (Rectal)  Resp 18  SpO2 95% Physical Exam  Nursing note and vitals reviewed. Constitutional: She is oriented to person, place, and time. She appears well-developed and well-nourished. No distress.  Appears fatigued.  HENT:  Head: Normocephalic and atraumatic.  Mouth/Throat: Oropharynx is clear and moist.  Eyes: EOM are normal. Pupils are equal, round, and reactive to light. Scleral icterus is present.  Neck: Normal range of motion. Neck supple.  Cardiovascular: Normal rate, regular rhythm and normal heart sounds.   Pulmonary/Chest: Effort normal and breath sounds normal. No respiratory distress.  Abdominal: Bowel sounds are normal. She exhibits distension. There is hepatomegaly. There is tenderness in the epigastric area. There is no rigidity, no rebound and no guarding.  No peritoneal signs.  Musculoskeletal: Normal range of motion. She exhibits no edema.  Neurological: She is alert and oriented to person, place, and time. She has normal strength. No cranial nerve deficit or sensory deficit. GCS eye subscore is 4. GCS verbal subscore is 5. GCS motor subscore is 6.  Skin: Skin is warm and dry. She is not diaphoretic.  Jaundiced.  Psychiatric: She has a normal mood and affect. Her behavior is normal.    ED Course  Procedures (including critical care time) Labs Review Labs Reviewed  CBC - Abnormal; Notable for the following:    RBC 2.43 (*)    Hemoglobin 8.8 (*)    HCT 24.7 (*)    MCV 101.6 (*)    MCH 36.2 (*)    RDW 15.9 (*)    All other components within normal limits  COMPREHENSIVE METABOLIC PANEL - Abnormal; Notable for the following:    Sodium 123 (*)    Potassium 3.2 (*)    Chloride 86 (*)    Glucose, Bld 108 (*)    Calcium 8.2 (*)    Albumin 2.4 (*)    AST 127 (*)    ALT 43 (*)    Alkaline Phosphatase 132 (*)    Total Bilirubin 15.2 (*)    GFR calc non Af Amer 69 (*)    GFR calc Af Amer  80 (*)    All other components within normal limits  URINALYSIS, ROUTINE W REFLEX MICROSCOPIC - Abnormal; Notable for the following:    Color, Urine RED (*)  APPearance TURBID (*)    Specific Gravity, Urine >1.046 (*)    Hgb urine dipstick SMALL (*)    Bilirubin Urine LARGE (*)    Ketones, ur 15 (*)    Urobilinogen, UA 4.0 (*)    Nitrite POSITIVE (*)    Leukocytes, UA LARGE (*)    All other components within normal limits  URINE MICROSCOPIC-ADD ON - Abnormal; Notable for the following:    Bacteria, UA MANY (*)    Casts HYALINE CASTS (*)    All other components within normal limits  POCT I-STAT, CHEM 8 - Abnormal; Notable for the following:    Sodium 129 (*)    Potassium 3.3 (*)    Chloride 88 (*)    Creatinine, Ser 1.20 (*)    Glucose, Bld 104 (*)    Calcium, Ion 1.08 (*)    Hemoglobin 9.9 (*)    HCT 29.0 (*)    All other components within normal limits  CG4 I-STAT (LACTIC ACID) - Abnormal; Notable for the following:    Lactic Acid, Venous 4.60 (*)    All other components within normal limits  CULTURE, BLOOD (ROUTINE X 2)  CULTURE, BLOOD (ROUTINE X 2)  URINE CULTURE  URINE CULTURE  LIPASE, BLOOD  OCCULT BLOOD, POC DEVICE   Imaging Review Dg Chest 2 View  08/26/2013   CLINICAL DATA:  Hypotension.  History of breast cancer and jaundice  EXAM: CHEST  2 VIEW  COMPARISON:  04/18/2013  FINDINGS: Patchy lung opacities on the right. Lung volumes are low with basilar atelectatic change. Normal heart size. No acute osseous findings. Status post left mastectomy and axillary dissection.  IMPRESSION: New right-sided airspace disease, primarily concerning for pneumonia. Recommend followup to clearing.   Electronically Signed   By: Tiburcio Pea M.D.   On: 08/26/2013 23:36   Ct Abdomen Pelvis W Contrast  08/26/2013   CLINICAL DATA:  New onset jaundice, history cirrhosis, hepatocellular carcinoma, chronic hepatitis-C, breast cancer  EXAM: CT ABDOMEN AND PELVIS WITH CONTRAST   TECHNIQUE: Multidetector CT imaging of the abdomen and pelvis was performed using the standard protocol following bolus administration of intravenous contrast. Sagittal and coronal MPR images reconstructed from axial data set.  CONTRAST:  50mL OMNIPAQUE IOHEXOL 300 MG/ML SOLN orally, OMNIPAQUE IOHEXOL 300 MG/ML SOLN IV  COMPARISON:  04/23/2013  FINDINGS: Bibasilar atelectasis.  Innumerable tiny low-attenuation foci identified throughtout a cirrhotic liver, new since previous study, question related to cirrhosis or hepatocellular carcinoma though peliosis hepatis can cause a similar appearance and has been described in patients with hepatocellular carcinoma.  Splenic enlargement, 7.5 x 10.8 x 14.9 cm.  Significant ascites.  No focal abnormalities of the spleen, pancreas, kidneys, or adrenal glands.  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  Enlarged periportal lymph node 2.6 x 2.3 cm, previously 2.2 x 1.9 cm.  Additional enlarged periportal lymph node is indistinguishable from the adjacent caudate lobe on today's exam, approximately 4.5 x 3.2 cm, previously 4.2 x 3.2 cm.  Perigastric and periesophageal varices/collaterals.  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  Significant wall thickening of the ascending colon and hepatic flexure compatible with colitis.  Stomach and remaining bowel loops grossly unremarkable.  Unremarkable bladder, uterus, and adnexae.  Scattered mesenteric edema.  No hernia or acute osseous findings.  IMPRESSION: Cirrhotic liver with splenomegaly, varices/collaterals and new ascites.  Interim innumerable tiny new low-attenuation foci throughout the liver, could be related to cirrhosis, hepatocellular carcinoma or peliosis hepatis.  Consider MR imaging with and without contrast for further evaluation.  Slight interval increase in size of periportal adenopathy.  Wall thickening of the hepatic flexure and ascending colon consistent with  colitis; differential diagnosis includes infection, inflammatory bowel disease and ischemia.   Electronically Signed   By: Ulyses Southward M.D.   On: 08/26/2013 21:27    EKG Interpretation   None       MDM   1. Ascites   2. Orthostatic hypotension   3. Colitis   4. UTI (urinary tract infection)     Pt presents with weakness, dizziness, orthostatic hypotension, hx of HCC undergoing radiation. She appears jaundiced, fatigued, NAD. Mild abdominal pain, distension. Labs pending- cbc, cmp, lipase, lactate, ua, lactic acid, blood culture. CT abd/pelv. Giving fluid bolus.  Hgb dropped from 11.7 to 8.8 over 1 week, fecal occult blood negative. CT abd showing cirrhotic liver with splenomegaly, varices/lateral and no ascites. Also concern for colitis. Urine positive for infection. IV Cipro and Flagyl started. Lactic acid 4.6.Blood pressure has been remaining stable, 107/61. Patient admitted to the hospitalist service, admission accepted by Dr. Waymon Amato, [facility] to step-down bed. CXR ordered per hospitalist.  Case discussed with attending Dr. Gwendolyn Grant who also evaluated patient and agrees with plan of care.  Trevor Mace, PA-C 08/27/13 [id number]

## 2013-08-26 NOTE — Progress Notes (Signed)
Jamie Diaz here for final treatment visit.  She has had 25 fractions to her abdomen.  She is having pain in her abdomen from dry heaving.  She had nausea all weekend.  She ate 3 forkfuls of food Saturday night.  She is also having diarrhea today.  She is taking imodium.  She is feeling faint and her voice is slurred.  Orthostatic vitals done: 108/63 and hr 99 laying down, 83/66 and hr 104 sitting.  She is very jaundiced.

## 2013-08-27 ENCOUNTER — Ambulatory Visit: Payer: Medicare Other

## 2013-08-27 DIAGNOSIS — E43 Unspecified severe protein-calorie malnutrition: Secondary | ICD-10-CM | POA: Insufficient documentation

## 2013-08-27 DIAGNOSIS — C50919 Malignant neoplasm of unspecified site of unspecified female breast: Secondary | ICD-10-CM

## 2013-08-27 DIAGNOSIS — C228 Malignant neoplasm of liver, primary, unspecified as to type: Secondary | ICD-10-CM

## 2013-08-27 DIAGNOSIS — J189 Pneumonia, unspecified organism: Secondary | ICD-10-CM | POA: Diagnosis present

## 2013-08-27 DIAGNOSIS — B182 Chronic viral hepatitis C: Secondary | ICD-10-CM

## 2013-08-27 DIAGNOSIS — D649 Anemia, unspecified: Secondary | ICD-10-CM

## 2013-08-27 DIAGNOSIS — D6959 Other secondary thrombocytopenia: Secondary | ICD-10-CM

## 2013-08-27 DIAGNOSIS — R188 Other ascites: Secondary | ICD-10-CM

## 2013-08-27 DIAGNOSIS — A419 Sepsis, unspecified organism: Secondary | ICD-10-CM | POA: Diagnosis present

## 2013-08-27 DIAGNOSIS — I951 Orthostatic hypotension: Secondary | ICD-10-CM

## 2013-08-27 LAB — CBC
HCT: 21.7 % — ABNORMAL LOW (ref 36.0–46.0)
HCT: 22.6 % — ABNORMAL LOW (ref 36.0–46.0)
Hemoglobin: 7.7 g/dL — ABNORMAL LOW (ref 12.0–15.0)
MCH: 36.2 pg — ABNORMAL HIGH (ref 26.0–34.0)
MCHC: 35.3 g/dL (ref 30.0–36.0)
MCHC: 35.4 g/dL (ref 30.0–36.0)
MCV: 101.9 fL — ABNORMAL HIGH (ref 78.0–100.0)
MCV: 103.2 fL — ABNORMAL HIGH (ref 78.0–100.0)
Platelets: 38 10*3/uL — ABNORMAL LOW (ref 150–400)
Platelets: 39 10*3/uL — ABNORMAL LOW (ref 150–400)
Platelets: 48 10*3/uL — ABNORMAL LOW (ref 150–400)
RBC: 2.14 MIL/uL — ABNORMAL LOW (ref 3.87–5.11)
RBC: 2.19 MIL/uL — ABNORMAL LOW (ref 3.87–5.11)
RDW: 16.1 % — ABNORMAL HIGH (ref 11.5–15.5)
RDW: 16.1 % — ABNORMAL HIGH (ref 11.5–15.5)
WBC: 5.1 10*3/uL (ref 4.0–10.5)
WBC: 6.5 10*3/uL (ref 4.0–10.5)

## 2013-08-27 LAB — COMPREHENSIVE METABOLIC PANEL
ALT: 35 U/L (ref 0–35)
AST: 101 U/L — ABNORMAL HIGH (ref 0–37)
Alkaline Phosphatase: 111 U/L (ref 39–117)
CO2: 21 mEq/L (ref 19–32)
Chloride: 92 mEq/L — ABNORMAL LOW (ref 96–112)
Creatinine, Ser: 0.89 mg/dL (ref 0.50–1.10)
GFR calc non Af Amer: 72 mL/min — ABNORMAL LOW (ref >90)
Potassium: 4.3 mEq/L (ref 3.5–5.1)
Sodium: 122 mEq/L — ABNORMAL LOW (ref 135–145)
Total Bilirubin: 14 mg/dL — ABNORMAL HIGH (ref 0.3–1.2)

## 2013-08-27 LAB — TYPE AND SCREEN
ABO/RH(D): A POS
Antibody Screen: NEGATIVE

## 2013-08-27 LAB — GI PATHOGEN PANEL BY PCR, STOOL
C difficile toxin A/B: NEGATIVE
E coli 0157 by PCR: NEGATIVE
G lamblia by PCR: NEGATIVE
Norovirus GI/GII: NEGATIVE
Rotavirus A by PCR: NEGATIVE
Salmonella by PCR: NEGATIVE
Shigella by PCR: NEGATIVE

## 2013-08-27 LAB — PROTIME-INR: Prothrombin Time: 27.5 seconds — ABNORMAL HIGH (ref 11.6–15.2)

## 2013-08-27 LAB — ABO/RH: ABO/RH(D): A POS

## 2013-08-27 MED ORDER — POTASSIUM CHLORIDE IN NACL 40-0.9 MEQ/L-% IV SOLN
INTRAVENOUS | Status: DC
  Administered 2013-08-27: 125 mL/h via INTRAVENOUS
  Administered 2013-08-28: 02:00:00 via INTRAVENOUS
  Administered 2013-08-28: 10 mL/h via INTRAVENOUS
  Filled 2013-08-27 (×5): qty 1000

## 2013-08-27 MED ORDER — PIPERACILLIN-TAZOBACTAM 3.375 G IVPB 30 MIN
3.3750 g | Freq: Once | INTRAVENOUS | Status: AC
  Administered 2013-08-27: 3.375 g via INTRAVENOUS
  Filled 2013-08-27 (×2): qty 50

## 2013-08-27 MED ORDER — VANCOMYCIN HCL IN DEXTROSE 1-5 GM/200ML-% IV SOLN
1000.0000 mg | Freq: Once | INTRAVENOUS | Status: AC
  Administered 2013-08-27: 1000 mg via INTRAVENOUS
  Filled 2013-08-27 (×2): qty 200

## 2013-08-27 MED ORDER — BIOTENE DRY MOUTH MT LIQD
15.0000 mL | Freq: Two times a day (BID) | OROMUCOSAL | Status: DC
  Administered 2013-08-27 – 2013-08-30 (×6): 15 mL via OROMUCOSAL

## 2013-08-27 MED ORDER — VANCOMYCIN HCL IN DEXTROSE 750-5 MG/150ML-% IV SOLN
750.0000 mg | Freq: Two times a day (BID) | INTRAVENOUS | Status: DC
  Administered 2013-08-27: 750 mg via INTRAVENOUS
  Filled 2013-08-27 (×2): qty 150

## 2013-08-27 MED ORDER — VANCOMYCIN HCL 500 MG IV SOLR: 500.0000 mg | Freq: Two times a day (BID) | INTRAVENOUS | Status: DC

## 2013-08-27 MED ORDER — CHLORHEXIDINE GLUCONATE 0.12 % MT SOLN
15.0000 mL | Freq: Two times a day (BID) | OROMUCOSAL | Status: DC
  Administered 2013-08-27 – 2013-08-30 (×8): 15 mL via OROMUCOSAL
  Filled 2013-08-27 (×10): qty 15

## 2013-08-27 MED ORDER — GLUCERNA SHAKE PO LIQD
237.0000 mL | Freq: Two times a day (BID) | ORAL | Status: DC
  Filled 2013-08-27 (×5): qty 237

## 2013-08-27 MED ORDER — PIPERACILLIN-TAZOBACTAM 3.375 G IVPB
3.3750 g | Freq: Three times a day (TID) | INTRAVENOUS | Status: DC
  Administered 2013-08-27 – 2013-08-28 (×4): 3.375 g via INTRAVENOUS
  Filled 2013-08-27 (×6): qty 50

## 2013-08-27 NOTE — Progress Notes (Signed)
Pharmacy Consult - Vancomycin & Zosyn   89 yoF with breast cancer and hepatocellular carcinoma s/p chemo, combined mastectomy and hepatectomy, completed last round of palliative radiation 12/15 when she began to feel lightheaded, dizzy, nauseated, jaundice appearance. CT abd in ER with cirrhotic liver, splenomegaly concerning for colitis, UA c/w UTI, CXR concerning for PNA. Abx started to cover all.  12/15 Cipro x 1  12/16 >> Vanc >>  12/16 >> Zosyn >>  Tmax: afeb WBCs: wnl Renal: Scr 0.89, CG 62, N 82  12/15 urine >> pending 12/15 blood x 2 >> pending 12/16 urine >> pending 12/16 GI pathogen panel >> pending 12/16 mrsa pcr >> negative  Abx was started earlier today, given improvement in Scr - will adjust Vancomycin to 750 mg IV q12h. Pharmacy will f/u  Geoffry Paradise, PharmD, BCPS Pager: [phone] 8:31 AM Pharmacy #: [phone]

## 2013-08-27 NOTE — Progress Notes (Signed)
INITIAL NUTRITION ASSESSMENT  Pt meets criteria for severe MALNUTRITION in the context of chronic illness as evidenced by <50% estimated energy intake for the past month in addition to pt with severe fluid accumulation/ascites.  DOCUMENTATION CODES Per approved criteria  -Severe malnutrition in the context of chronic illness   INTERVENTION: - Recommend MD discuss enteral nutrition with pt as pt not likely to have significant improvement in PO intake soon and pt with severe malnutrition. May need palliative care consult to help determine goals of care.  - Educated pt and sister on high calorie/protein diet and provided handouts of this information - Glucerna shakes BID for additional nutrition as it is not as sweet as other nutritional supplements - Will continue to monitor  NUTRITION DIAGNOSIS: Inadequate oral intake related to poor appetite as evidenced by pt's and sister's report.   Goal: Pt to consume >90% of meals/supplements  Monitor:  Weights, labs, intake  Reason for Assessment: Malnutrition screening tool   52 y.o. female  Admitting Dx: Colitis  ASSESSMENT: Pt is a 52 y.o. female with history of metastatic recurrent hepatocellular carcinoma, breast cancer, chronic hepatitis C, cirrhosis, ongoing tobacco abuse, alcohol use, chemotherapy/sorafenib on hold secondary to GI toxicity and completed the last dose of radiation treatment yesterday, referred from radiation oncology due to nausea, dry heaving, diarrhea, poor oral intake, dizziness, jaundice and orthostatic hypotension. Patient gives a long-standing history of intermittent diarrhea ongoing since start of chemotherapy, intermittently decreases after taking antimotility medications, had decreased for a couple of days and then started back today. She is very poor oral intake and has been having nausea and dry heaving. She also complains of intermittent abdominal pain. She does give history of intermittent abdominal distention,  found to have significant ascites. Patient states that she may have seen a streak of blood in the stools. Chest x-ray suggests new right-sided airspace disease concerning for pneumonia, CT abdomen with cirrhotic liver, splenomegaly findings suggestive of colitis.  Met with pt and sister who report pt has not eaten anything for 4 days PTA. Sister reports pt with minimal intake for the past 5 weeks with pt consuming 1 very small meal/day, typically only a few bites of food to a few ounces. Pt tried [employer] but said it tasted too sweet and made her sick and nauseated. Pt has had dry heaving with phlegm daily for over 1 month. Pt had explosive diarrhea on Friday and had a small BM Sunday. Pt states the abdominal distention she is having today is the worst its ever been. Pt jaundiced. Pt stated she felt weak PTA. Reports her weight goes up/down 1-2 pounds each week and that she weighed 112 pounds 1 week ago, now weighs 119 pounds.   - Sodium low - AST elevated - Total bilirubin significantly elevated  Height: Ht Readings from Last 1 Encounters:  08/27/13 5\' 4"  (1.626 m)    Weight: Wt Readings from Last 1 Encounters:  08/27/13 119 lb 11.4 oz (54.3 kg)    Ideal Body Weight: 120 lb  % Ideal Body Weight: 99%  Wt Readings from Last 10 Encounters:  08/27/13 119 lb 11.4 oz (54.3 kg)  08/26/13 115 lb 4.8 oz (52.3 kg)  08/23/13 114 lb 12.8 oz (52.073 kg)  08/21/13 114 lb 6.4 oz (51.891 kg)  08/13/13 115 lb 3.2 oz (52.254 kg)  08/06/13 117 lb 8 oz (53.298 kg)  08/06/13 116 lb (52.617 kg)  07/30/13 114 lb 14.4 oz (52.118 kg)  07/23/13 114 lb 3.2  oz (51.801 kg)  07/09/13 112 lb 8 oz (51.03 kg)    Usual Body Weight: 112 lb per family  % Usual Body Weight: 106%  BMI:  Body mass index is 20.54 kg/(m^2).  Estimated Nutritional Needs: Kcal: 1600-1800 Protein: 65-80g Fluid: 1.6-1.8L/day  Skin: WNL  Diet Order: Full Liquid  EDUCATION NEEDS: -Education needs addressed -  discussed high calorie/protein diet and provided handouts of this information   Intake/Output Summary (Last 24 hours) at 08/27/13 1208 Last data filed at 08/27/13 1100  Gross per 24 hour  Intake 1820.83 ml  Output      0 ml  Net 1820.83 ml    Last BM: 12/16  Labs:   Recent Labs Lab 08/26/13 1955 08/26/13 2006 08/27/13 0600  NA 123* 129* 122*  K 3.2* 3.3* 4.3  CL 86* 88* 92*  CO2 24  --  21  BUN 12 11 11   CREATININE 0.92 1.20* 0.89  CALCIUM 8.2*  --  7.2*  GLUCOSE 108* 104* 97    CBG (last 3)  No results found for this basename: GLUCAP,  in the last 72 hours  Scheduled Meds: . antiseptic oral rinse  15 mL Mouth Rinse q12n4p  . chlorhexidine  15 mL Mouth Rinse BID  . piperacillin-tazobactam (ZOSYN)  IV  3.375 g Intravenous Q8H  . sodium chloride  3 mL Intravenous Q12H  . vancomycin  750 mg Intravenous Q12H    Continuous Infusions:   Past Medical History  Diagnosis Date  . Cancer   . Hepatitis C, chronic 04/10/2013    Dx 2011  . Hepatocellular carcinoma 04/18/2013  . Breast cancer 2011    breast ca    Past Surgical History  Procedure Laterality Date  . Appendectomy    . Mastectomy, radical Left 07/26/2010  . Open partial hepatectomy [83]  07/26/2010  . Port-a-cath removal  03/2010  . Port-a-cath removal  10/2011    piece left in that had to be removed    Levon Hedger MS, RD, LDN [phone] Pager [phone] After Hours Pager

## 2013-08-27 NOTE — Evaluation (Signed)
Occupational Therapy Evaluation Patient Details Name: Jamie Diaz MRN: [id number] DOB: 04-27-1961 Today's Date: 08/27/2013 Time: 4098-1191 OT Time Calculation (min): 29 min  OT Assessment / Plan / Recommendation History of present illness HPI: Jamie Diaz is a 52 y.o. female with history of metastatic recurrent hepatocellular carcinoma, breast cancer, chronic hepatitis C, cirrhosis, ongoing tobacco abuse, alcohol use, chemotherapy, radiation treatment . Admitted from radiology  due to nausea, dry heaving, diarrhea, poor oral intake, dizziness, jaundice and orthostatic hypotension. Pt gives history of intermittent diarrhea ongoing since start of chemotherapy, i She is very poor oral intake and has been having nausea and dry heaving.   Chest x-ray suggests new right-sided airspace disease concerning for pneumonia, CT abdomen with cirrhotic liver, splenomegaly findings suggestive of colitis.    Clinical Impression   Pt was admitted for the above.  She is deconditioned and has limited activity tolerance.  Pt will benefit from skilled OT to increase safety and independence with adls.  Pt was independent prior to admission.  Goals in acute are for min A overall.      OT Assessment  Patient needs continued OT Services    Follow Up Recommendations  SNF    Barriers to Discharge      Equipment Recommendations  3 in 1 bedside comode    Recommendations for Other Services    Frequency  Min 2X/week    Precautions / Restrictions Precautions Precautions: Fall Precaution Comments: contact, urgency for loose BM Restrictions Weight Bearing Restrictions: No   Pertinent Vitals/Pain No c/o pain.  Pt did have dry heaves.  HR 88-112, sats 93%    ADL  Grooming: Teeth care;Minimal assistance Where Assessed - Grooming: Unsupported sitting Upper Body Bathing: Moderate assistance Where Assessed - Upper Body Bathing: Unsupported sitting Lower Body Bathing: Maximal assistance Where Assessed - Lower Body  Bathing: Supported sit to stand Upper Body Dressing: Moderate assistance Where Assessed - Upper Body Dressing: Unsupported sitting Lower Body Dressing: +1 Total assistance Where Assessed - Lower Body Dressing: Supported sit to Pharmacist, hospital: Moderate assistance Toilet Transfer Method: Stand pivot Toilet Transfer Equipment: Bedside commode Toileting - Clothing Manipulation and Hygiene: +1 Total assistance Where Assessed - Toileting Clothing Manipulation and Hygiene: Sit to stand from 3-in-1 or toilet Transfers/Ambulation Related to ADLs: SPT to 3:1 and back to bed ADL Comments: Pt with poor activity tolerance.  Very motivated and willing to try.  Encouraged small bursts of activity throughout day.  Began energy conservation education    OT Diagnosis: Generalized weakness  OT Problem List: Decreased strength;Decreased activity tolerance;Impaired balance (sitting and/or standing);Decreased knowledge of use of DME or AE OT Treatment Interventions: Self-care/ADL training;DME and/or AE instruction;Patient/family education;Therapeutic activities;Balance training;Energy conservation   OT Goals(Current goals can be found in the care plan section) Acute Rehab OT Goals Patient Stated Goal: get strength back OT Goal Formulation: With patient Time For Goal Achievement: 09/10/13 Potential to Achieve Goals: Good ADL Goals Pt Will Transfer to Toilet: with min assist;bedside commode;stand pivot transfer Pt Will Perform Toileting - Clothing Manipulation and hygiene: with min assist;sit to/from stand Additional ADL Goal #1: pt will complete UB adls with set up and 3 rest breaks for energy conservation  Visit Information         Prior Functioning     Home Living Family/patient expects to be discharged to:: Unsure Home Equipment: None Prior Function Level of Independence: Independent Comments: daughter coming in from [place] Friday Communication Communication:  (speech dysarthric,  ? fatique)  Vision/Perception     Cognition  Cognition Arousal/Alertness: Awake/alert Behavior During Therapy: WFL for tasks assessed/performed Overall Cognitive Status: Within Functional Limits for tasks assessed    Extremity/Trunk Assessment Upper Extremity Assessment Upper Extremity Assessment: Generalized weakness (has bone on bone in R shoulder)     Mobility Bed Mobility Supine to Sit: 4: Min assist Sit to Supine: 4: Min assist Details for Bed Mobility Assistance: assist for LEs Transfers Sit to Stand: 3: Mod assist;From toilet;From bed;With upper extremity assist Stand to Sit: 4: Min assist;To bed;To chair/3-in-1;With upper extremity assist Details for Transfer Assistance: assist to rise and steady     Exercise     Balance Static Sitting Balance Static Sitting - Balance Support: No upper extremity supported Static Sitting - Level of Assistance: 5: Stand by assistance Static Standing Balance Static Standing - Balance Support: Right upper extremity supported Static Standing - Level of Assistance: 4: Min assist Static Standing - Comment/# of Minutes: stood for 3 minutes for hygiene   End of Session OT - End of Session Activity Tolerance: Patient limited by fatigue Patient left: in bed;with call bell/phone within reach;with family/visitor present  GO     Eion Timbrook 08/27/2013, 3:22 PM Marica Otter, OTR/L [phone] 08/27/2013

## 2013-08-27 NOTE — ED Provider Notes (Signed)
Medical screening examination/treatment/procedure(s) were conducted as a shared visit with non-physician practitioner(s) and myself.  I personally evaluated the patient during the encounter.  EKG Interpretation    Date/Time:    Ventricular Rate:    PR Interval:    QRS Duration:   QT Interval:    QTC Calculation:   R Axis:     Text Interpretation:               Patient here with weakness, dizziness, malaise for  Days. Recently finished radiation today. Generalized belly swelling and distention which is new. No fevers.  CT scan shows acites, urine shows UTI. Admitted.   Dagmar Hait, MD 08/27/13 [phone]

## 2013-08-27 NOTE — Progress Notes (Signed)
CARE MANAGEMENT NOTE 08/27/2013  Patient:  Jamie Diaz, Jamie Diaz   Account Number:  [id number]  Date Initiated:  08/27/2013  Documentation initiated by:  DAVIS,RHONDA  Subjective/Objective Assessment:   hypotension, hepatic ca, poss pna     Action/Plan:   home when stable   Anticipated DC Date:  08/30/2013   Anticipated DC Plan:  HOME/SELF CARE  In-house referral  NA      DC Planning Services  NA      [facility] Choice  NA   Choice offered to / List presented to:  NA   DME arranged  NA      DME agency  NA     HH arranged  NA      HH agency  NA   Status of service:  In process, will continue to follow Medicare Important Message given?  NA - LOS <3 / Initial given by admissions (If response is "NO", the following Medicare IM given date fields will be blank) Date Medicare IM given:   Date Additional Medicare IM given:    Discharge Disposition:    Per UR Regulation:  Reviewed for med. necessity/level of care/duration of stay  If discussed at Long Length of Stay Meetings, dates discussed:    Comments:  [id number]/Rhonda Stark Jock, BSN, [place] [phone] Chart Reviewed for discharge and hospital needs. Discharge needs at time of review:  None present will follow for needs. Review of patient progress due on [id number].

## 2013-08-27 NOTE — Progress Notes (Addendum)
52 year old woman with synchronous primary invasive breast cancer and initial stage I hepatocellular carcinoma diagnosed in May 2011. She also has underlying hepatitis C. She had a metastatic recurrence of her liver cancer in July of this year. Please see my office progress note from December 12 for additional details. She recently completed a course of palliative radiation to both the periportal lymphadenopathy in an attempt to forestall extrahepatic obstruction. When I saw her this Friday she was overall stable. Her liver functions had deteriorated since starting radiation a few weeks ago the bilirubin appeared stable at 5.2 when checked on December 8. She completed her radiation treatments yesterday. She states that while she was in the radiation suite she became orthostatic and her eyes rolled back and she was passed out. She was sent to the emergency department for further evaluation.  She's been very thirsty. She denied vomiting. She denied diarrhea. She denied any fever. No chills.  When lab was repeated yesterday there was a marked change in her bilirubin now up to 15. She was afebrile. However, lactic acid significantly elevated at 4.6. Urine appears grossly infected. A CT scan was done. This does not show any gross progression of her disease. There are changes of advanced cirrhosis and splenomegaly, varices, known periportal adenopathy. Some very vague changes in the hepatic flexure and ascending colon all consistent with colitis" although she has no symptoms to suggest colitis.  On exam: Large 136/43, pulse 99 regular, respirations 16, temperature 98.1 orally, oxygen saturation 94% on room air. She is alert and oriented, speech is somewhat garbled but she is cooperative and I can understand her. She is markedly jaundiced. Lungs are clear Regular cardiac rhythm no murmur Abdomen is markedly distended with a positive fluid wave. Enlarged liver. Extremities no edema, no calf  tenderness Neurologic: Nonfocal. Pupils are small round and weakly reactive. Motor strength is 5 over 5. Speech somewhat garbled.  Impression: #1. Sudden, rapid, deterioration in liver function in a lady with known metastatic hepatocellular carcinoma who just completed a course of palliative radiation yesterday. I think it is reasonable to conclude that she is septic from a urinary tract infection.  although the source is not obvious  Another concern would be some radiation necrosis of her liver from the recent treatments. There does not appear to be any gross progression of her cancer compared with her pre-radiation scans. Recommendation:  It is reasonable to cover her with antibiotics. She does not have an indwelling catheter. There are no breaks in the  skin. She has not been on chemotherapy. I would drop the vancomycin and discontinue the Zosyn.  #2. Metastatic hepatocellular carcinoma. Initial trial of sorafenib met with unacceptable toxicity. I was going to wait until she had some recovery from her radiation and then try to go back on the sorafenib at 50% dose. Her prognosis is very poor. I had a lengthy discussion with her just 4 days ago with respect to end-of-life issues. She would not want her life prolonged by unnecessary or mechanical means. I did establish a N0 CODE STATUS in the chart.  #3. Invasive cancer the left breast treated with neoadjuvant chemotherapy, mastectomy, and additional adjuvant chemotherapy.  #4. Chronic hepatitis C infection  #5. History of alcohol abuse.   #6. Ascites secondary to #2. She will likely need a palliative paracentesis for symptomatic relief.  Thank you for informing me that the patient was admitted. I will follow her along with you.

## 2013-08-27 NOTE — Evaluation (Signed)
Physical Therapy Evaluation Patient Details Name: Jamie Diaz MRN: [id number] DOB: 06-18-1961 Today's Date: 08/27/2013 Time: 4098-1191 PT Time Calculation (min): 24 min  PT Assessment / Plan / Recommendation History of Present Illness  HPI: Jamie Diaz is a 52 y.o. female  Admitted 08/26/14 with history of metastatic recurrent hepatocellular carcinoma, breast cancer, chronic hepatitis C, cirrhosis, ongoing tobacco abuse, alcohol use, chemotherapy, radiation treatment . Admitted from radiology  due to nausea, dry heaving, diarrhea, poor oral intake, dizziness, jaundice and orthostatic hypotension. Pt gives history of intermittent diarrhea ongoing since start of chemotherapy, i She is very poor oral intake and has been having nausea and dry heaving.   Chest x-ray suggests new right-sided airspace disease concerning for pneumonia, CT abdomen with cirrhotic liver, splenomegaly findings suggestive of colitis.   Clinical Impression  Pt is very weak and unsteady while ambulating in room. Pt will benefit from PT to address problems. Pt reports she will be home alone. Recommend SNF     PT Assessment  Patient needs continued PT services    Follow Up Recommendations  SNF    Does the patient have the potential to tolerate intense rehabilitation      Barriers to Discharge Decreased caregiver support      Equipment Recommendations  Rolling walker with 5" wheels    Recommendations for Other Services     Frequency Min 3X/week    Precautions / Restrictions Precautions Precautions: Fall Precaution Comments: contact, urgency for loose BM Restrictions Weight Bearing Restrictions: No   Pertinent Vitals/Pain no pain HR 115 sats 100% ra     Mobility  Bed Mobility Bed Mobility: Supine to Sit;Sitting - Scoot to [place] of Bed;Sit to Supine Supine to Sit: 4: Min guard Sitting - Scoot to [place] of Bed: 4: Min guard Sit to Supine: 4: Min guard Details for Bed Mobility Assistance: extra time to get  into upright position. Transfers Transfers: Sit to Stand;Stand to Sit Sit to Stand: 3: Mod assist;From toilet;From bed;With upper extremity assist Stand to Sit: To bed;To toilet;With upper extremity assist Details for Transfer Assistance: lifting assistance to stand. Ambulation/Gait Ambulation/Gait Assistance: 1: +2 Total assist Ambulation/Gait: Patient Percentage: 60% Ambulation Distance (Feet): 10 Feet (x2) Assistive device: 2 person hand held assist Ambulation/Gait Assistance Details: gait is very unsteady, wide base, trunk swaying. requires support for safety and balance loss. Gait Pattern: Step-to pattern;Decreased stride length;Wide base of support    Exercises     PT Diagnosis: Difficulty walking;Generalized weakness  PT Problem List: Decreased strength;Decreased activity tolerance;Decreased balance;Decreased mobility;Decreased knowledge of precautions;Decreased safety awareness;Decreased knowledge of use of DME PT Treatment Interventions: DME instruction;Gait training;Functional mobility training;Therapeutic activities;Therapeutic exercise;Patient/family education     PT Goals(Current goals can be found in the care plan section) Acute Rehab PT Goals Patient Stated Goal: I want to get better. PT Goal Formulation: With patient Time For Goal Achievement: 09/10/13 Potential to Achieve Goals: Good  Visit Information  Last PT Received On: 08/27/13 Assistance Needed: +2 History of Present Illness: HPI: Jamie Diaz is a 52 y.o. female with history of metastatic recurrent hepatocellular carcinoma, breast cancer, chronic hepatitis C, cirrhosis, ongoing tobacco abuse, alcohol use, chemotherapy, radiation treatment . Admitted from radiology  due to nausea, dry heaving, diarrhea, poor oral intake, dizziness, jaundice and orthostatic hypotension. Pt gives history of intermittent diarrhea ongoing since start of chemotherapy, i She is very poor oral intake and has been having nausea and dry  heaving.   Chest x-ray suggests new right-sided airspace disease  concerning for pneumonia, CT abdomen with cirrhotic liver, splenomegaly findings suggestive of colitis.        Prior Functioning  Home Living Family/patient expects to be discharged to:: Private residence Living Arrangements: Other relatives Available Help at Discharge: Family Type of Home: Apartment Home Access: Level entry Home Layout: One level Home Equipment: None Prior Function Level of Independence: Independent Communication Communication:  (speech is slurred and difficult to understand at times.)    Cognition  Cognition Arousal/Alertness: Awake/alert Behavior During Therapy: WFL for tasks assessed/performed Overall Cognitive Status: Impaired/Different from baseline Area of Impairment: Orientation Orientation Level: Time    Extremity/Trunk Assessment Upper Extremity Assessment Upper Extremity Assessment: Defer to OT evaluation Lower Extremity Assessment Lower Extremity Assessment: Generalized weakness   Balance Balance Balance Assessed: Yes Static Sitting Balance Static Sitting - Balance Support: Bilateral upper extremity supported;Feet supported Static Sitting - Level of Assistance: 5: Stand by assistance Static Standing Balance Static Standing - Balance Support: Right upper extremity supported Static Standing - Level of Assistance: 3: Mod assist Static Standing - Comment/# of Minutes: stood for hygiene.  End of Session PT - End of Session Activity Tolerance: Patient limited by fatigue Patient left: in bed;with call bell/phone within reach;with bed alarm set Nurse Communication: Mobility status  GP     Rada Hay 08/27/2013, 10:29 AM Blanchard Kelch PT [phone]

## 2013-08-27 NOTE — Progress Notes (Signed)
Patient ID: Jamie Diaz, female   DOB: [birth year], 52 y.o.   MRN: [id number] [facility] PROGRESS NOTE  Jamie Diaz JXB:[id number] DOB: 1961-03-22 DOA: 08/26/2013 PCP: No PCP Per Patient  Brief narrative: 52 y.o. female with past medical history of metastatic hepatocellular carcinoma, breast cancer, chronic hepatitis C, cirrhosis, ongoing tobacco abuse, alcohol use, chemotherapy/sorafenib on hold secondary to GI toxicity and completed the last dose of radiation treatment 08/26/2013 who was referred from radiation oncology for ongoing nausea, dry heaving, diarrhea, poor oral intake, dizziness, jaundice and orthostatic hypotension. In the ED, BP was 83/66, HR 96-104, Tmax 99.1 F and Oxygen saturation of 93% on 2 L nasal canula. Blood work revealed sodium of 129, potassium 3.2, abnormal LFTs with total bilirubin of 15, albumin 2.4 and hemoglobin of 8.8 (11.7 on 08/19/13).  Chest x-ray suggested new right-sided airspace disease concerning for pneumonia, CT abdomen was significant with cirrhotic liver, splenomegaly findings suggestive of colitis.  Assessment/Plan:  Principal Problem:   Dehydration, weakness, failure to thrive - likely due to progressive hepatocellular carcinoma, UTI, colitis - continue IV fluids for another 24 hours and then reassess if still needed  - continue antiemetics as needed and analgesia with dilaudid 0.5 mg every 4 hours IV PRN - PT evaluation for safe discharge plan Active Problems:   Sepsis, UTI - secondary to UTI -pt was hypotensive, tachycardic with low grade fever on the admission with evidence of UTI on urinalysis. This satisifes the criteria for sepsis - was started on vanco and zosyn but per oncology will d/c vanco and just continue zosyn until urine culture results available.   Metastatic hepatocellular carcinoma - will contineu chemotherapy with sorafenib per oncology - was no code per onc but on admission she said she changed to full code status   Malignant  ascites - order placed for US paracentesis   Colitis - in hepatic flexure region and ascending colon - zosyn would cover for colitis as well   Anemia of chronic disease - secondary to malignancy and sequela of chemotherapy - hemoglobin 8 today - no indications for transfusion    Thrombocytopenia - likely sequela of chemotherapy - use SCD's for DVT prophylaxis   Code Status: full code Family Communication: sister at the bedside Disposition Plan: home when stable; transfer to telemetry today  Manson Passey, MD  [facility] Pager [phone]  If 7PM-7AM, please contact night-coverage [email or web address] Password [email or web address] 08/27/2013, 7:33 AM   LOS: 1 day   Consultants:  Oncology (Dr. Cyndie Chime)  Procedures:  None   Antibiotics:  Zosyn 08/26/2013 -->  Vanco 08/26/2013 --> 08/27/2013   HPI/Subjective: No overnight events.   Objective: Filed Vitals:   08/27/13 0100 08/27/13 0300 08/27/13 0400 08/27/13 0600  BP: 121/48 133/40 133/46 125/52  Pulse: 96 103 96 99  Temp:   97.8 F (36.6 C)   TempSrc:   Oral   Resp: 15 17 16 15   Height: 5\' 4"  (1.626 m)     Weight: 54.3 kg (119 lb 11.4 oz)     SpO2: 95% 95% 95% 93%    Intake/Output Summary (Last 24 hours) at 08/27/13 8469 Last data filed at 08/27/13 0700  Gross per 24 hour  Intake 1270.83 ml  Output      0 ml  Net 1270.83 ml    Exam:   General:  Pt is alert, follows commands appropriately, not in acute distress; jaundiced skin  Cardiovascular: Regular rate and rhythm, S1/S2, no murmurs, no rubs, no gallops  Respiratory: Clear  to auscultation bilaterally, no wheezing, no crackles, no rhonchi  Abdomen: Distended but no significant tenderness appreciated  Extremities: No edema, pulses DP and PT palpable bilaterally  Neuro: Grossly nonfocal  Data Reviewed: Basic Metabolic Panel:  Recent Labs Lab 08/26/13 1955 08/26/13 2006 08/27/13 0600  NA 123* 129* 122*  K 3.2* 3.3* 4.3  CL 86* 88* 92*  CO2 24   --  21  GLUCOSE 108* 104* 97  BUN 12 11 11   CREATININE 0.92 1.20* 0.89  CALCIUM 8.2*  --  7.2*   Liver Function Tests:  Recent Labs Lab 08/26/13 1955 08/27/13 0600  AST 127* 101*  ALT 43* 35  ALKPHOS 132* 111  BILITOT 15.2* 14.0*  PROT 7.6 6.5  ALBUMIN 2.4* 2.0*    Recent Labs Lab 08/26/13 1955  LIPASE 13   No results found for this basename: AMMONIA,  in the last 168 hours CBC:  Recent Labs Lab 08/26/13 1955 08/26/13 2006 08/27/13 0049 08/27/13 0600  WBC 6.9  --  5.1 4.9  HGB 8.8* 9.9* 7.7* 7.7*  HCT 24.7* 29.0* 21.7* 21.8*  MCV 101.6*  --  101.9* 101.9*  PLT PLATELET CLUMPS NOTED ON SMEAR  --  38* 39*   Cardiac Enzymes: No results found for this basename: CKTOTAL, CKMB, CKMBINDEX, TROPONINI,  in the last 168 hours BNP: No components found with this basename: POCBNP,  CBG: No results found for this basename: GLUCAP,  in the last 168 hours  MRSA PCR SCREENING     Status: None   Collection Time    08/27/13 12:04 AM      Result Value Range Status   MRSA by PCR NEGATIVE  NEGATIVE Final     Studies: Dg Chest 2 View 08/26/2013    IMPRESSION: New right-sided airspace disease, primarily concerning for pneumonia. Recommend followup to clearing.    Ct Abdomen Pelvis W Contrast 08/26/2013    IMPRESSION: Cirrhotic liver with splenomegaly, varices/collaterals and new ascites.  Interim innumerable tiny new low-attenuation foci throughout the liver, could be related to cirrhosis, hepatocellular carcinoma or peliosis hepatis.  Consider MR imaging with and without contrast for further evaluation.  Slight interval increase in size of periportal adenopathy.  Wall thickening of the hepatic flexure and ascending colon consistent with colitis; differential diagnosis includes infection, inflammatory bowel disease and ischemia.      Scheduled Meds: . antiseptic oral rinse  15 mL Mouth Rinse q12n4p  . chlorhexidine  15 mL Mouth Rinse BID  . piperacillin-tazobactam (ZOSYN)  IV   3.375 g Intravenous Q8H  . sodium chloride  3 mL Intravenous Q12H  . vancomycin  500 mg Intravenous Q12H   Continuous Infusions: . 0.9 % NaCl with KCl 40 mEq / L 125 mL/hr at 08/27/13 0026

## 2013-08-27 NOTE — Progress Notes (Signed)
ANTIBIOTIC CONSULT NOTE - INITIAL  Pharmacy Consult for Vancomycin and Zosyn  Indication: pneumonia, UTI, colitis  Allergies  Allergen Reactions  . Ibuprofen Hives and Palpitations    Patient Measurements: Height: 5\' 4"  (162.6 cm) Weight: 119 lb 11.4 oz (54.3 kg) IBW/kg (Calculated) : 54.7 Adjusted Body Weight:   Vital Signs: Temp: 97.8 F (36.6 C) (12/16 0400) Temp src: Oral (12/16 0400) BP: 133/46 mmHg (12/16 0400) Pulse Rate: 96 (12/16 0400) Intake/Output from previous day: 12/15 0701 - 12/16 0700 In: 895.8 [I.V.:445.8; IV Piggyback:450] Out: -  Intake/Output from this shift: Total I/O In: 895.8 [I.V.:445.8; IV Piggyback:450] Out: -   Labs:  Recent Labs  08/26/13 1955 08/26/13 2006 08/27/13 0049  WBC 6.9  --  5.1  HGB 8.8* 9.9* 7.7*  PLT PLATELET CLUMPS NOTED ON SMEAR  --  38*  CREATININE 0.92 1.20*  --    Estimated Creatinine Clearance: 45.9 ml/min (by C-G formula based on Cr of 1.2). No results found for this basename: VANCOTROUGH, Leodis Binet, VANCORANDOM, GENTTROUGH, GENTPEAK, GENTRANDOM, TOBRATROUGH, TOBRAPEAK, TOBRARND, AMIKACINPEAK, AMIKACINTROU, AMIKACIN,  in the last 72 hours   Microbiology: Recent Results (from the past 720 hour(s))  MRSA PCR SCREENING     Status: None   Collection Time    08/27/13 12:04 AM      Result Value Range Status   MRSA by PCR NEGATIVE  NEGATIVE Final   Comment:            The GeneXpert MRSA Assay (FDA     approved for NASAL specimens     only), is one component of a     comprehensive MRSA colonization     surveillance program. It is not     intended to diagnose MRSA     infection nor to guide or     monitor treatment for     MRSA infections.    Medical History: Past Medical History  Diagnosis Date  . Cancer   . Hepatitis C, chronic 04/10/2013    Dx 2011  . Hepatocellular carcinoma 04/18/2013  . Breast cancer 2011    breast ca    Medications:  Anti-infectives   Start     Dose/Rate Route Frequency Ordered  Stop   08/27/13 1800  vancomycin (VANCOCIN) 500 mg in sodium chloride 0.9 % 100 mL IVPB     500 mg 100 mL/hr over 60 Minutes Intravenous Every 12 hours 08/27/13 0540     08/27/13 1000  ciprofloxacin (CIPRO) IVPB 400 mg  Status:  Discontinued     400 mg 200 mL/hr over 60 Minutes Intravenous Every 12 hours 08/26/13 2356 08/27/13 0018   08/27/13 0800  piperacillin-tazobactam (ZOSYN) IVPB 3.375 g     3.375 g 12.5 mL/hr over 240 Minutes Intravenous Every 8 hours 08/27/13 0540     08/27/13 0600  metroNIDAZOLE (FLAGYL) IVPB 500 mg  Status:  Discontinued     500 mg 100 mL/hr over 60 Minutes Intravenous Every 8 hours 08/26/13 2356 08/27/13 0018   08/27/13 0045  vancomycin (VANCOCIN) IVPB 1000 mg/200 mL premix     1,000 mg 200 mL/hr over 60 Minutes Intravenous  Once 08/27/13 0031 08/27/13 0337   08/27/13 0045  piperacillin-tazobactam (ZOSYN) IVPB 3.375 g     3.375 g 100 mL/hr over 30 Minutes Intravenous  Once 08/27/13 0031 08/27/13 0307   08/26/13 2200  ciprofloxacin (CIPRO) IVPB 400 mg     400 mg 200 mL/hr over 60 Minutes Intravenous  Once 08/26/13 2151 08/27/13 0041  08/26/13 2200  metroNIDAZOLE (FLAGYL) IVPB 500 mg  Status:  Discontinued     500 mg 100 mL/hr over 60 Minutes Intravenous  Once 08/26/13 2151 08/27/13 0018     Assessment: Patient with PNA, UTI, colitis.  First dose of antibiotics already given.  Goal of Therapy:  Vancomycin trough level 15-20 mcg/ml Zosyn based on renal function   Plan:  Measure antibiotic drug levels at steady state Follow up culture results Vancomycin 500mg  iv q12hr Zosyn 3.375g IV Q8H infused over 4hrs.   Darlina Guys, Jacquenette Shone Crowford 08/27/2013,5:41 AM

## 2013-08-28 ENCOUNTER — Inpatient Hospital Stay (HOSPITAL_COMMUNITY): Payer: Medicare Other

## 2013-08-28 DIAGNOSIS — R55 Syncope and collapse: Secondary | ICD-10-CM

## 2013-08-28 DIAGNOSIS — K7460 Unspecified cirrhosis of liver: Secondary | ICD-10-CM

## 2013-08-28 DIAGNOSIS — E871 Hypo-osmolality and hyponatremia: Secondary | ICD-10-CM

## 2013-08-28 DIAGNOSIS — D630 Anemia in neoplastic disease: Secondary | ICD-10-CM

## 2013-08-28 LAB — URINE CULTURE
Colony Count: NO GROWTH
Culture: NO GROWTH

## 2013-08-28 LAB — BODY FLUID CELL COUNT WITH DIFFERENTIAL
Lymphs, Fluid: 8 %
Total Nucleated Cell Count, Fluid: 210 cu mm (ref 0–1000)

## 2013-08-28 LAB — BILIRUBIN, FRACTIONATED(TOT/DIR/INDIR)
Bilirubin, Direct: 13 mg/dL — ABNORMAL HIGH (ref 0.0–0.3)
Indirect Bilirubin: 4.2 mg/dL — ABNORMAL HIGH (ref 0.3–0.9)
Total Bilirubin: 17.2 mg/dL — ABNORMAL HIGH (ref 0.3–1.2)

## 2013-08-28 MED ORDER — LEVOFLOXACIN IN D5W 500 MG/100ML IV SOLN
500.0000 mg | INTRAVENOUS | Status: DC
  Administered 2013-08-28: 500 mg via INTRAVENOUS
  Filled 2013-08-28 (×2): qty 100

## 2013-08-28 MED ORDER — HYDROMORPHONE HCL PF 1 MG/ML IJ SOLN
0.5000 mg | INTRAMUSCULAR | Status: DC | PRN
  Administered 2013-08-28 – 2013-08-29 (×5): 0.5 mg via INTRAVENOUS
  Filled 2013-08-28 (×5): qty 1

## 2013-08-28 NOTE — Progress Notes (Addendum)
Clinical Social Work Department CLINICAL SOCIAL WORK PLACEMENT NOTE 08/28/2013  Patient:  ZALEAH, TERNES  Account Number:  [id number] Admit date:  08/26/2013  Clinical Social Worker:  Unk Lightning, LCSW  Date/time:  08/28/2013 10:00 AM  Clinical Social Work is seeking post-discharge placement for this patient at the following level of care:   SKILLED NURSING   (*CSW will update this form in Epic as items are completed)   08/28/2013  Patient/family provided with [facility] Department of Clinical Social Work's list of facilities offering this level of care within the geographic area requested by the patient (or if unable, by the patient's family).  08/28/2013  Patient/family informed of their freedom to choose among providers that offer the needed level of care, that participate in Medicare, Medicaid or managed care program needed by the patient, have an available bed and are willing to accept the patient.  08/28/2013  Patient/family informed of [facility]' ownership interest in [facility], as well as of the fact that they are under no obligation to receive care at this facility.  PASARR submitted to EDS on 08/28/2013 PASARR number received from EDS on 08/28/2013  FL2 transmitted to all facilities in geographic area requested by pt/family on  08/28/2013 FL2 transmitted to all facilities within larger geographic area on   Patient informed that his/her managed care company has contracts with or will negotiate with  certain facilities, including the following:     Patient/family informed of bed offers received:   Patient chooses bed at  Physician recommends and patient chooses bed at    Patient to be transferred to  on   Patient to be transferred to facility by   The following physician request were entered in Epic:   Additional Comments:  08/30/13-Patient has declined and is now hospice appropriate.

## 2013-08-28 NOTE — Clinical Documentation Improvement (Signed)
Possible Clinical Conditions?  Severe Malnutrition   Protein Calorie Malnutrition Severe Protein Calorie Malnutrition Other Condition Cannot clinically determine  Supporting Information:(As per Nutrition assessment by Lavena Bullion, RD at 08/27/2013 12:08 PM)  Signs & Symptoms:Pt meets criteria for severe MALNUTRITION in the context of chronic illness as evidenced by <50% estimated energy intake for the past month in addition to pt with severe fluid accumulation/ascites.  Diagnostics: DOCUMENTATION CODES  Per approved criteria   -Severe malnutrition in the context of chronic illness     Treatment:  Thank You, Nevin Bloodgood, RN,BSN,CCDS, Clinical Documentation Specialist:  [phone]   [phone] [place]- Health Information Management

## 2013-08-28 NOTE — Procedures (Signed)
US guided diagnostic/therapeutic paracentesis performed yielding 2.5 liters turbid,amber fluid. A portion of the fluid was sent to the lab for preordered studies. No immediate complications.

## 2013-08-28 NOTE — Progress Notes (Addendum)
Patient ID: Jamie Diaz, female   DOB: 1960/10/06, 52 y.o.   MRN: [id number] [facility] PROGRESS NOTE  Jamie Diaz:[id number] DOB: May 25, 1961 DOA: 08/26/2013 PCP: No PCP Per Patient  Brief narrative: 52 y.o. female with past medical history of metastatic hepatocellular carcinoma, breast cancer, chronic hepatitis C, cirrhosis, ongoing tobacco abuse, alcohol use, chemotherapy/sorafenib on hold secondary to GI toxicity and completed the last dose of radiation treatment 08/26/2013 who was referred from radiation oncology for ongoing nausea, dry heaving, diarrhea, poor oral intake, dizziness, jaundice and orthostatic hypotension. In the ED, BP was 83/66, HR 96-104, Tmax 99.1 F and Oxygen saturation of 93% on 2 L nasal canula. Blood work revealed sodium of 129, potassium 3.2, abnormal LFTs with total bilirubin of 15, albumin 2.4 and hemoglobin of 8.8 (11.7 on 08/19/13).  Chest x-ray suggested new right-sided airspace disease concerning for pneumonia, CT abdomen was significant with cirrhotic liver, splenomegaly findings suggestive of colitis.  Assessment/Plan:    Sepsis, UTI - secondary to UTI, colitis +/- Pneumonia - blood Cx negative so far    Metastatic hepatocellular carcinoma - recent chemo on hold,  - Dr.Granfortuna following,  -prognosis appears guarded    Malignant ascites -US paracentesis today, therapeutic and diagnostic    Colitis - in hepatic flexure region and ascending colon - GI pathogen panel positive for Campylobacter - change Abx to levaquin   E Coli UTI -intermediate S to Zosyn -change to levaquin  CIrrhosis/HEp C   Anemia of chronic disease - precipitous drop in hb, check hemolysis panel - secondary to malignancy and sequela of chemotherapy    Thrombocytopenia - likely sequela of liver disease and chemotherapy    Elevated Bili - reason for acute rise unclear - radiation necrosis of the liver a possibility - r/o hemolysis, fractionate Bili -  multifactorial from Sepsis/HCC/cirrhosis/mets/chemo etc    Severe protein calorie malnutrition  -nutrition following  - use SCD's for DVT prophylaxis   Code Status: DNR, discussed in detail with patient again Family Communication: none  at the bedside Disposition Plan: home when stable  Zannie Cove, MD  [facility] Pager [phone]  If 7PM-7AM, please contact night-coverage [email or web address] Password [email or web address] 08/28/2013, 3:03 PM   LOS: 2 days   Consultants:  Oncology (Dr. Cyndie Chime)  Procedures:  None   Antibiotics:  Zosyn 08/26/2013 -->  Vanco 08/26/2013 --> 08/27/2013   HPI/Subjective: No overnight events.   Objective: Filed Vitals:   08/28/13 1330 08/28/13 1355 08/28/13 1405 08/28/13 1435  BP: 106/77 109/50 115/60 93/58  Pulse:    95  Temp:    97.9 F (36.6 C)  TempSrc:    Oral  Resp:    14  Height:      Weight:      SpO2:    92%    Intake/Output Summary (Last 24 hours) at 08/28/13 1503 Last data filed at 08/28/13 0600  Gross per 24 hour  Intake 1809.17 ml  Output    145 ml  Net 1664.17 ml    Exam:   General:  Pt is alert, follows commands appropriately, not in acute distress; jaundiced skin  Cardiovascular: Regular rate and rhythm, S1/S2, no murmurs, no rubs, no gallops  Respiratory: Clear to auscultation bilaterally, no wheezing, no crackles, no rhonchi  Abdomen: Distended but no significant tenderness appreciated  Extremities: No edema, pulses DP and PT palpable bilaterally  Neuro: Grossly nonfocal  Data Reviewed: Basic Metabolic Panel:  Recent Labs Lab 08/26/13 1955 08/26/13 2006 08/27/13 0600  NA 123* 129*  122*  K 3.2* 3.3* 4.3  CL 86* 88* 92*  CO2 24  --  21  GLUCOSE 108* 104* 97  BUN 12 11 11   CREATININE 0.92 1.20* 0.89  CALCIUM 8.2*  --  7.2*   Liver Function Tests:  Recent Labs Lab 08/26/13 1955 08/27/13 0600 08/28/13 1204  AST 127* 101*  --   ALT 43* 35  --   ALKPHOS 132* 111  --   BILITOT 15.2*  14.0* 17.2*  PROT 7.6 6.5  --   ALBUMIN 2.4* 2.0*  --     Recent Labs Lab 08/26/13 1955  LIPASE 13   No results found for this basename: AMMONIA,  in the last 168 hours CBC:  Recent Labs Lab 08/26/13 1955 08/26/13 2006 08/27/13 0049 08/27/13 0600 08/27/13 1225  WBC 6.9  --  5.1 4.9 6.5  HGB 8.8* 9.9* 7.7* 7.7* 8.0*  HCT 24.7* 29.0* 21.7* 21.8* 22.6*  MCV 101.6*  --  101.9* 101.9* 103.2*  PLT PLATELET CLUMPS NOTED ON SMEAR  --  38* 39* 48*   Cardiac Enzymes: No results found for this basename: CKTOTAL, CKMB, CKMBINDEX, TROPONINI,  in the last 168 hours BNP: No components found with this basename: POCBNP,  CBG: No results found for this basename: GLUCAP,  in the last 168 hours  MRSA PCR SCREENING     Status: None   Collection Time    08/27/13 12:04 AM      Result Value Range Status   MRSA by PCR NEGATIVE  NEGATIVE Final     Studies: Dg Chest 2 View 08/26/2013    IMPRESSION: New right-sided airspace disease, primarily concerning for pneumonia. Recommend followup to clearing.    Ct Abdomen Pelvis W Contrast 08/26/2013    IMPRESSION: Cirrhotic liver with splenomegaly, varices/collaterals and new ascites.  Interim innumerable tiny new low-attenuation foci throughout the liver, could be related to cirrhosis, hepatocellular carcinoma or peliosis hepatis.  Consider MR imaging with and without contrast for further evaluation.  Slight interval increase in size of periportal adenopathy.  Wall thickening of the hepatic flexure and ascending colon consistent with colitis; differential diagnosis includes infection, inflammatory bowel disease and ischemia.      Scheduled Meds: . antiseptic oral rinse  15 mL Mouth Rinse q12n4p  . chlorhexidine  15 mL Mouth Rinse BID  . feeding supplement (GLUCERNA SHAKE)  237 mL Oral BID BM  . levofloxacin (LEVAQUIN) IV  500 mg Intravenous Q24H  . sodium chloride  3 mL Intravenous Q12H   Continuous Infusions: . 0.9 % NaCl with KCl 40 mEq / L 10  mL/hr (08/28/13 1148)

## 2013-08-28 NOTE — Progress Notes (Signed)
She remains afebrile Blood and urine cultures results still outstanding She has a sense of urinary urgency but no significant residual urine on straight cath. On exam, she has increase in abdominal distention from tense ascites accumulation. This is likely putting pressure on her bladder and causing her symptoms. She is having low-grade loose stools every time she tries to void. She remains markedly jaundiced. No significant change in her liver functions with bilirubin 14. Renal function normal at present. Hemoglobin stable at 8 g. Platelet count 48,000. Impression: #1. Known metastatic hepatocellular carcinoma just completed a course of palliative radiation to  periportal lymphadenopathy. #2. Sudden, acute, deterioration in liver function likely due to  infection probably urinary source #3. Low-grade diarrhea secondary to #2 #4. Progressive, symptomatic, ascites. She will have a paracentesis procedure this morning. #5. Coagulopathy secondary to liver failure pro time 27.5 seconds INR 2.67 Recent data suggests no need to transfuse fresh frozen plasma to cover a minor procedure such as a paracentesis despite liver related coagulopathy since there is a discordance between bleeding risk and elevated pro time in liver disease. #6. Known hepatitis C infection #7. Status post left mastectomy and chemotherapy for invasive breast cancer. #8. Anemia secondary to advanced cancer #9. Thrombocytopenia secondary to cirrhosis, splenomegaly, and sepsis. Prognosis remains guarded Per my office discussion with the patient and her ex-husband at time of visit December 12, she should be NO CODE STATUS.  continue to treat a potentially reversible elements of her illness.

## 2013-08-28 NOTE — Progress Notes (Signed)
Pt complaining of having pressure in her lower abdomen and the urge to urinate, but only being able to urinate small drops. Pt assisted the the bedside commode and pt only able to urinate small amount. Pt bladder scanned and scan shows >480cc.K. Schorr,NP called and notified of pt's complaints.  In and out Cath attempted on pt and approximately 20cc of orange urine returned. Some resistance was noted upon advancement catheter. Pt tolerated procedure well.

## 2013-08-28 NOTE — Progress Notes (Signed)
Clinical Social Work Department BRIEF PSYCHOSOCIAL ASSESSMENT 08/28/2013  Patient:  Jamie Diaz, Jamie Diaz     Account Number:  [id number]     Admit date:  08/26/2013  Clinical Social Worker:  Dennison Bulla  Date/Time:  08/28/2013 10:00 AM  Referred by:  Physician  Date Referred:  08/28/2013 Referred for  SNF Placement   Other Referral:   Interview type:  Patient Other interview type:    PSYCHOSOCIAL DATA Living Status:  FAMILY Admitted from facility:   Level of care:   Primary support name:  Darl Pikes Primary support relationship to patient:  SIBLING Degree of support available:   Strong    CURRENT CONCERNS Current Concerns  Post-Acute Placement   Other Concerns:    SOCIAL WORK ASSESSMENT / PLAN CSW received referral in order to assist with DC planning. CSW reviewed chart and met with patient at bedside. CSW introduced myself and explained role.    Patient reports that she lives at home with twin sister who assists her as needed but works during the day. Patient states that she was not using any equipment prior to admission but has been weak lately. Patient reports that she would prefer to return home but understands that SNF is being recommended.    CSW provided SNF list and explained process. Patient wants to stay in [facility] and will discuss plans with her sister. Patient spoke about missing family events such as the holidays and her dtr giving birth. CSW validated patient's feelings and will continue to offer support.    CSW completed FL2 and faxed out. CSW will follow up with bed offers.   Assessment/plan status:  Psychosocial Support/Ongoing Assessment of Needs Other assessment/ plan:   Information/referral to community resources:   SNF list    PATIENT'S/FAMILY'S RESPONSE TO PLAN OF CARE: Patient alert and oriented. Patient would prefer to go home but understanding of SNF needs. Patient thanked CSW for time and agreeable to follow up.       [place], [place]  [phone]

## 2013-08-29 DIAGNOSIS — R627 Adult failure to thrive: Secondary | ICD-10-CM

## 2013-08-29 DIAGNOSIS — N19 Unspecified kidney failure: Secondary | ICD-10-CM

## 2013-08-29 DIAGNOSIS — K759 Inflammatory liver disease, unspecified: Secondary | ICD-10-CM

## 2013-08-29 DIAGNOSIS — K769 Liver disease, unspecified: Secondary | ICD-10-CM

## 2013-08-29 LAB — CBC
Hemoglobin: 8.1 g/dL — ABNORMAL LOW (ref 12.0–15.0)
MCHC: 33.2 g/dL (ref 30.0–36.0)
RBC: 2.3 MIL/uL — ABNORMAL LOW (ref 3.87–5.11)
RDW: 16.3 % — ABNORMAL HIGH (ref 11.5–15.5)
WBC: 10.5 10*3/uL (ref 4.0–10.5)

## 2013-08-29 LAB — COMPREHENSIVE METABOLIC PANEL
ALT: 34 U/L (ref 0–35)
Albumin: 2 g/dL — ABNORMAL LOW (ref 3.5–5.2)
Alkaline Phosphatase: 115 U/L (ref 39–117)
CO2: 17 mEq/L — ABNORMAL LOW (ref 19–32)
GFR calc Af Amer: 86 mL/min — ABNORMAL LOW (ref >90)
GFR calc non Af Amer: 74 mL/min — ABNORMAL LOW (ref >90)
Glucose, Bld: 100 mg/dL — ABNORMAL HIGH (ref 70–99)
Potassium: 6 mEq/L — ABNORMAL HIGH (ref 3.5–5.1)
Sodium: 124 mEq/L — ABNORMAL LOW (ref 135–145)
Total Protein: 6.9 g/dL (ref 6.0–8.3)

## 2013-08-29 LAB — BASIC METABOLIC PANEL
BUN: 19 mg/dL (ref 6–23)
CO2: 18 mEq/L — ABNORMAL LOW (ref 19–32)
Calcium: 8.1 mg/dL — ABNORMAL LOW (ref 8.4–10.5)
Chloride: 100 mEq/L (ref 96–112)
Creatinine, Ser: 0.93 mg/dL (ref 0.50–1.10)
GFR calc Af Amer: 79 mL/min — ABNORMAL LOW (ref >90)
GFR calc non Af Amer: 68 mL/min — ABNORMAL LOW (ref >90)
Glucose, Bld: 98 mg/dL (ref 70–99)
Potassium: 6.7 mEq/L (ref 3.5–5.1)
Sodium: 126 mEq/L — ABNORMAL LOW (ref 135–145)

## 2013-08-29 LAB — AMMONIA

## 2013-08-29 LAB — PATHOLOGIST SMEAR REVIEW

## 2013-08-29 MED ORDER — HYDROMORPHONE HCL PF 1 MG/ML IJ SOLN
1.0000 mg | INTRAMUSCULAR | Status: DC | PRN
  Administered 2013-08-29: 1 mg via INTRAVENOUS

## 2013-08-29 MED ORDER — SODIUM POLYSTYRENE SULFONATE 15 GM/60ML PO SUSP
30.0000 g | Freq: Once | ORAL | Status: DC
  Filled 2013-08-29: qty 120

## 2013-08-29 MED ORDER — INSULIN ASPART 100 UNIT/ML IV SOLN
10.0000 [IU] | Freq: Once | INTRAVENOUS | Status: AC
  Administered 2013-08-29: 10 [IU] via INTRAVENOUS
  Filled 2013-08-29: qty 0.1

## 2013-08-29 MED ORDER — PROMETHAZINE HCL 25 MG/ML IJ SOLN
12.5000 mg | Freq: Four times a day (QID) | INTRAMUSCULAR | Status: DC | PRN
  Administered 2013-08-29 – 2013-08-30 (×3): 12.5 mg via INTRAVENOUS
  Filled 2013-08-29 (×3): qty 1

## 2013-08-29 MED ORDER — LEVOFLOXACIN IN D5W 750 MG/150ML IV SOLN
750.0000 mg | INTRAVENOUS | Status: DC
  Filled 2013-08-29: qty 150

## 2013-08-29 MED ORDER — HYDROMORPHONE HCL PF 1 MG/ML IJ SOLN
1.0000 mg | INTRAMUSCULAR | Status: DC | PRN
  Administered 2013-08-29 – 2013-08-30 (×7): 1 mg via INTRAVENOUS
  Filled 2013-08-29 (×7): qty 1

## 2013-08-29 MED ORDER — INSULIN ASPART 100 UNIT/ML IV SOLN: 10.0000 [IU] | Freq: Once | INTRAVENOUS | Status: DC

## 2013-08-29 MED ORDER — DEXTROSE 50 % IV SOLN
1.0000 | Freq: Once | INTRAVENOUS | Status: AC
  Administered 2013-08-29: 50 mL via INTRAVENOUS
  Filled 2013-08-29: qty 50

## 2013-08-29 NOTE — Progress Notes (Signed)
Physical Therapy Discharge Patient Details Name: Jamie Diaz MRN: [id number] DOB: Sep 03, 1961 Today's Date: 08/29/2013 Time:  -     Patient discharged from PT services secondary to medical decline - will need to re-order PT to resume therapy services.  Please see latest therapy progress note for current level of functioning and progress toward goals.    Progress and discharge plan discussed with patient and/or caregiver: discussed with RN  Family of pt and RN seen outside room in hallway having discussion.  Spoke with RN thereafter who states pt is now comfort care and therapy not indicated at this time.  GP     Jamie Diaz,Jamie Diaz 08/29/2013, 1:34 PM  Zenovia Jarred, PT, DPT 08/29/2013 Pager: [phone]

## 2013-08-29 NOTE — Progress Notes (Addendum)
Patient ID: Jamie Diaz, female   DOB: [birth year], 52 y.o.   MRN: [id number] [facility] PROGRESS NOTE  CALEB PRIGMORE WGN:[id number] DOB: [birth year] DOA: 08/26/2013 PCP: No PCP Per Patient  Brief narrative: 52 y.o. female with past medical history of metastatic hepatocellular carcinoma, breast cancer, chronic hepatitis C, cirrhosis, ongoing tobacco abuse, alcohol use, chemotherapy/sorafenib on hold secondary to GI toxicity and completed the last dose of radiation treatment 08/26/2013 who was referred from radiation oncology for ongoing nausea, dry heaving, diarrhea, poor oral intake, dizziness, jaundice and orthostatic hypotension. In the ED, BP was 83/66, HR 96-104, Tmax 99.1 F and Oxygen saturation of 93% on 2 L nasal canula. Blood work revealed sodium of 129, potassium 3.2, abnormal LFTs with total bilirubin of 15, albumin 2.4 and hemoglobin of 8.8 (11.7 on 08/19/13).  Chest x-ray suggested new right-sided airspace disease concerning for pneumonia, CT abdomen was significant with cirrhotic liver, splenomegaly findings suggestive of colitis.  Assessment/Plan:    Sepsis, UTI - secondary to UTI, colitis +/- Pneumonia - blood Cx negative so far - comfort care now, see discussion below    Metastatic hepatocellular carcinoma - recent chemo on hold,  - Dr.Granfortuna following,  - prognosis very poor   Deteriorating liver function/Elevated Bili - rapidly worsening liver function - radiation necrosis of the liver a possibility/concern - no hemolysis, fractionate Bili more c/w direct hyperbili - multifactorial from Sepsis/HCC/cirrhosis/mets/chemo etc    Cirrhosis/HEp C    Malignant ascites -s/p US paracentesis, therapeutic and diagnostic\ -2.5L removed, negative for SBP    Infectious Colitis - in hepatic flexure region and ascending colon - GI pathogen panel positive for Campylobacter - changed Abx to levaquin   E Coli UTI -intermediate S to Zosyn -changed to  levaquin  CIrrhosis/HEp C   Anemia of chronic disease - precipitous drop in hb, hemolysis panel not remarkable - secondary to malignancy and sequela of chemotherapy    Thrombocytopenia - likely sequela of liver disease and chemotherapy     Severe protein calorie malnutrition  -nutrition following  - use SCD's for DVT prophylaxis  GLOBAL: Very Poor Prognosis, DNR now D/w Dr.Granfortuna who is recommending Hospice, expectant survival days to weeks I discussed this with daughter, her exhusband and sister  Today, they are agreeable to Residential Hospice for end of life/comfort care -stop Abx and other medications that are not for comfort -CSW consult for [facility]  Code Status: DNR, discussed with pt yesterday and daughter and sisters at bedside today Family Communication: d/w daughter and sisters at bedside Disposition Plan: residential hospice if survives   [place], MD  [facility] Pager [phone]  If 7PM-7AM, please contact night-coverage [email or web address] Password [email or web address] 08/29/2013, 11:23 AM   LOS: 3 days   Consultants:  Oncology (Dr. Cyndie Chime)  Procedures:  None   Antibiotics:  Zosyn 08/26/2013 -->  Vanco 08/26/2013 --> 08/27/2013   HPI/Subjective: No overnight events.   Objective: Filed Vitals:   08/28/13 2154 08/29/13 0630 08/29/13 0631 08/29/13 0632  BP: 113/56 112/59 104/74 103/66  Pulse: 98 97 104 105  Temp: 97.5 F (36.4 C) 97.8 F (36.6 C)    TempSrc: Oral Oral    Resp: 18 18    Height:      Weight:  61 kg (134 lb 7.7 oz)    SpO2: 93% 93%      Intake/Output Summary (Last 24 hours) at 08/29/13 1123 Last data filed at 08/28/13 1520  Gross per 24 hour  Intake    100 ml  Output      0 ml  Net    100 ml    Exam:   General:  Obtunded, arouses to verbal stimuli,  jaundiced skin  Cardiovascular: Regular rate and rhythm, S1/S2, no murmurs, no rubs, no gallops  Respiratory: Clear to auscultation bilaterally, no wheezing,  no crackles, no rhonchi  Abdomen: Distended but no significant tenderness appreciated  Extremities: No edema, pulses DP and PT palpable bilaterally  Neuro: Grossly nonfocal  Data Reviewed: Basic Metabolic Panel:  Recent Labs Lab 08/26/13 1955 08/26/13 2006 08/27/13 0600 08/29/13 0525  NA 123* 129* 122* 124*  K 3.2* 3.3* 4.3 6.0*  CL 86* 88* 92* 98  CO2 24  --  21 17*  GLUCOSE 108* 104* 97 100*  BUN 12 11 11 17   CREATININE 0.92 1.20* 0.89 0.87  CALCIUM 8.2*  --  7.2* 7.9*   Liver Function Tests:  Recent Labs Lab 08/26/13 1955 08/27/13 0600 08/28/13 1204 08/29/13 0525  AST 127* 101*  --  91*  ALT 43* 35  --  34  ALKPHOS 132* 111  --  115  BILITOT 15.2* 14.0* 17.2* 18.4*  PROT 7.6 6.5  --  6.9  ALBUMIN 2.4* 2.0*  --  2.0*    Recent Labs Lab 08/26/13 1955  LIPASE 13    Recent Labs Lab 08/29/13 0525  AMMONIA RESULTS UNAVAILABLE DUE TO INTERFERING SUBSTANCE   CBC:  Recent Labs Lab 08/26/13 1955 08/26/13 2006 08/27/13 0049 08/27/13 0600 08/27/13 1225 08/29/13 0525  WBC 6.9  --  5.1 4.9 6.5 10.5  HGB 8.8* 9.9* 7.7* 7.7* 8.0* 8.1*  HCT 24.7* 29.0* 21.7* 21.8* 22.6* 24.4*  MCV 101.6*  --  101.9* 101.9* 103.2* 106.1*  PLT PLATELET CLUMPS NOTED ON SMEAR  --  38* 39* 48* 62*   Cardiac Enzymes: No results found for this basename: CKTOTAL, CKMB, CKMBINDEX, TROPONINI,  in the last 168 hours BNP: No components found with this basename: POCBNP,  CBG: No results found for this basename: GLUCAP,  in the last 168 hours  MRSA PCR SCREENING     Status: None   Collection Time    08/27/13 12:04 AM      Result Value Range Status   MRSA by PCR NEGATIVE  NEGATIVE Final     Studies: Dg Chest 2 View 08/26/2013    IMPRESSION: New right-sided airspace disease, primarily concerning for pneumonia. Recommend followup to clearing.    Ct Abdomen Pelvis W Contrast 08/26/2013    IMPRESSION: Cirrhotic liver with splenomegaly, varices/collaterals and new ascites.   Interim innumerable tiny new low-attenuation foci throughout the liver, could be related to cirrhosis, hepatocellular carcinoma or peliosis hepatis.  Consider MR imaging with and without contrast for further evaluation.  Slight interval increase in size of periportal adenopathy.  Wall thickening of the hepatic flexure and ascending colon consistent with colitis; differential diagnosis includes infection, inflammatory bowel disease and ischemia.      Scheduled Meds: . antiseptic oral rinse  15 mL Mouth Rinse q12n4p  . chlorhexidine  15 mL Mouth Rinse BID  . sodium chloride  3 mL Intravenous Q12H   Continuous Infusions:

## 2013-08-29 NOTE — Progress Notes (Signed)
CRITICAL VALUE ALERT  Critical value received: Total Bil. 18.4  Date of notification: 08/29/13  Time of notification:  0630  Critical value read back:yes  Nurse who received alert: Sherlon Handing  MD notified (1st page): P.Joseph  Reported off to on coming nurse that MD text paged.

## 2013-08-29 NOTE — Consult Note (Signed)
[facility] Liaison: Received request from CSW for family interest in [facility]. Chart reviewed. Await response re eligibility/availability. Will update CSW. Thank you. Forrestine Him LCSW [phone]

## 2013-08-29 NOTE — Progress Notes (Signed)
Intermittent confusion and garbled speech but she was able to have a coherent conversation with me in the company of her sister and a close friend. She would really like to go home but this is not practical. Her situation is very unstable. Potassium level of 6.7 today. Bicarbonate falling but anion gap normal at present. Rapid reaccumulation of abdominal ascites just 24 hours after a paracentesis procedure.  Falling urine output. Stool cultures on admission growing Campylobacter. Bilirubin remains markedly elevated at 18 mg Impression: #1. End-stage liver disease-multifactorial-metastatic recurrence of hepatocellular carcinoma, hepatitis C, possibility of radiation necrosis and infection as other major contributing factors. #2. Refractory ascites secondary to #1. #3. Markedly decreased urine output. She will likely develop renal failure soon.  Disposition: I had a lengthy discussion with her sister by phone yesterday and in person at the bedside today. I think the best option for her would be transfer to [facility] facility where she can receive optimal supportive care. I discussed this with the hospital attending physician who initiated a referral through our hospice liason person.

## 2013-08-29 NOTE — Progress Notes (Signed)
Clinical Social Work  CSW received referral from MD to offer hospice choice. CSW met with patient, sister and sister-in-law at bedside. Patient's HCPOA is dtr and sister listed. Dtr is pregnant and scheduled for c-section tomorrow morning. Sister reports that she and dtr have spoken about plans and both agreeable for residential hospice.  CSW offered choice and family prefers [facility] due to proximity of house. Sister emotional and reports "I promised I would never put her in a home." CSW explained that residential hospice is different from SNF placement. CSW validated sister's feelings and provided emotional support. CSW explained that CSW had met with patient yesterday and patient told stories of living with her and their relationship. CSW encouraged sister to see that she is making the best decision by choosing what would make the patient most comfortable. Sister thanked CSW for telling stories that patient had said about her on the previous day.  CSW made referral to Carley Hammed at [facility] who is reviewing referral.  Unk Lightning, LCSW [phone]

## 2013-08-30 ENCOUNTER — Inpatient Hospital Stay (HOSPITAL_COMMUNITY): Payer: Medicare Other

## 2013-08-30 DIAGNOSIS — B182 Chronic viral hepatitis C: Secondary | ICD-10-CM

## 2013-08-30 DIAGNOSIS — C50919 Malignant neoplasm of unspecified site of unspecified female breast: Secondary | ICD-10-CM

## 2013-08-30 DIAGNOSIS — C228 Malignant neoplasm of liver, primary, unspecified as to type: Secondary | ICD-10-CM

## 2013-08-30 DIAGNOSIS — E875 Hyperkalemia: Secondary | ICD-10-CM

## 2013-08-30 DIAGNOSIS — J189 Pneumonia, unspecified organism: Secondary | ICD-10-CM

## 2013-08-30 DIAGNOSIS — K7460 Unspecified cirrhosis of liver: Secondary | ICD-10-CM

## 2013-08-30 MED ORDER — MORPHINE SULFATE (CONCENTRATE) 10 MG /0.5 ML PO SOLN: 5.0000 mg | ORAL | Status: AC | PRN

## 2013-08-30 MED ORDER — LORAZEPAM 1 MG PO TABS: 1.0000 mg | ORAL_TABLET | ORAL | Status: AC | PRN

## 2013-08-30 NOTE — Progress Notes (Signed)
Report called Alger Memos at [facility].  Philomena Doheny RN

## 2013-08-30 NOTE — Progress Notes (Signed)
She is alert & oriented; C/O urinary urgency/frequency Abdomen distended, tense, rapid re-accumulation of ascites - likely putting pressure on bladder to cause her GU sxs. Borderline urine output I/O: likely inaccurate - she is voiding small amounts and does not have a foley catheter No peripheral edema Impression: #1. End-stage liver disease-multifactorial-metastatic recurrence of hepatocellular carcinoma, hepatitis C, possibility of radiation necrosis and infection as other major contributing factors.  #2. Refractory ascites secondary to #1.  #3. Decreased urine output. She will likely develop renal failure soon. #4. Hyperkalemia  Recommend: Repeat palliative paracentesis today She is under evaluation for [facility] placement. Sister at bedside - status reviewed I am away next week.  Please call my service if we can be of assistance.  Thanks.

## 2013-08-30 NOTE — Discharge Summary (Signed)
Physician Discharge Summary  Jamie Diaz ZOX:[id number] DOB: 1961-01-31 DOA: 08/26/2013  PCP: No PCP Per Patient  Admit date: 08/26/2013 Discharge date: 08/30/2013  Time spent: 45 minutes  Discharge to [facility]  Discharge Diagnoses:    Sepsis   Worsening liver function   metastatic hepatocellular cancer   Breast cancer   Cirrhosis of liver   Chronic pain   Hepatitis C, chronic   Ascites   UTI (lower urinary tract infection)   Dehydration with hyponatremia   Hypokalemia   Anemia   Thrombocytopenia   Failure to thrive   Hypotension   Near syncope   HCAP (healthcare-associated pneumonia)   Protein-calorie malnutrition, severe   Discharge Condition: poor  Diet recommendation: comfort feeds  Filed Weights   08/28/13 0604 08/29/13 0630 08/30/13 0647  Weight: 61.4 kg (135 lb 5.8 oz) 61 kg (134 lb 7.7 oz) 61.1 kg (134 lb 11.2 oz)    History of present illness:  52 y.o. female with past medical history of metastatic hepatocellular carcinoma, breast cancer, chronic hepatitis C, cirrhosis, ongoing tobacco abuse, alcohol use, chemotherapy/sorafenib on hold secondary to GI toxicity and completed the last dose of radiation treatment 08/26/2013 who was referred from radiation oncology for ongoing nausea, dry heaving, diarrhea, poor oral intake, dizziness, jaundice and orthostatic hypotension. In the ED, BP was 83/66, HR 96-104, Tmax 99.1 F and Oxygen saturation of 93% on 2 L nasal canula. Blood work revealed sodium of 129, potassium 3.2, abnormal LFTs with total bilirubin of 15, albumin 2.4 and hemoglobin of 8.8 (11.7 on 08/19/13). Chest x-ray suggested new right-sided airspace disease concerning for pneumonia, CT abdomen was significant with cirrhotic liver, splenomegaly findings suggestive of colitis.    Hospital Course: Ms. Jamie Diaz is a 52 year old female with multiple medical problems namely chronic hepatitis C, cirrhosis, metastatic hepatocellular carcinoma, was recently  getting chemotherapy which had to be discontinued due to intolerance, subsequently had radiation to her liver and she completed last week. Was admitted to the hospital with sepsis, pneumonia, Escherichia coli UTI and colitis. Her liver function tests rapidly declined in the setting of sepsis and suspected radiation necrosis of the liver,  She did not improve despite broad-spectrum antibiotics and supportive care. She also underwent abdominal paracentesis for symptomatic relief on 12/17. Finally in the setting of worsening liver function, advanced metastatic hepatocellular carcinoma, patient is felt to be rapidly declining and near terminal, discussion was held with the family by myself as well as Dr. Cyndie Chime her oncologist. Decision was made for comfort care, and residential hospice.  GLOBAL: Very Poor Prognosis, DNR now  D/w Dr.Granfortuna who is recommending Hospice, expectant survival days to weeks  I discussed this with daughter, her exhusband and sister Today, they are agreeable to [facility] for end of life/comfort care  -stop Abx and other medications that are not for comfort  -CSW consult for [facility]      Procedures:  Paracentesis  Consultations:  Onc Dr.Granfortuna  Discharge Exam: Filed Vitals:   08/30/13 0647  BP: 94/64  Pulse: 97  Temp: 97.9 F (36.6 C)  Resp: 18    General: lethargic, arousible, jaundiced Cardiovascular: S1S2/RRR Respiratory:diminished at bases  Discharge Instructions   Future Appointments Provider Department Dept Phone   09/17/2013 10:00 AM [facility]-Ct 2 [facility] [phone]   Please pick up your oral contrast prep at your scheduled location at least 1 day prior to your appointment. Liquids only 4 hours prior to your exam. Any medications can be taken  as usual. Please arrive 15 min prior to your scheduled exam time.   09/20/2013 11:15 AM [facility] Only [facility]  [phone]   09/20/2013 11:45 AM Rana Snare, NP [facility] MEDICAL ONCOLOGY [phone]   10/18/2013 10:30 AM [facility] 4 [facility] [phone]   10/18/2013 11:00 AM Levert Feinstein, MD [facility] [phone]       Medication List    STOP taking these medications       ALPRAZolam 0.5 MG tablet  Commonly known as:  XANAX     citalopram 20 MG tablet  Commonly known as:  CELEXA     hyaluronate sodium Gel     SORAfenib 200 MG tablet  Commonly known as:  NEXAVAR      TAKE these medications       loperamide 2 MG capsule  Commonly known as:  IMODIUM  Take 2 mg by mouth 4 (four) times daily as needed for diarrhea or loose stools.     LORazepam 1 MG tablet  Commonly known as:  ATIVAN  Place 1 tablet (1 mg total) under the tongue every 2 (two) hours as needed for anxiety or sedation. DO NOT USE WITH XANAX-     morphine CONCENTRATE 10 mg / 0.5 ml concentrated solution  Take 0.25 mLs (5 mg total) by mouth every 2 (two) hours as needed for moderate pain or shortness of breath.     ondansetron 4 MG disintegrating tablet  Commonly known as:  ZOFRAN-ODT  1 - 2 sublingual every 8 hours as needed for nausea       Allergies  Allergen Reactions  . Ibuprofen Hives and Palpitations      The results of significant diagnostics from this hospitalization (including imaging, microbiology, ancillary and laboratory) are listed below for reference.    Significant Diagnostic Studies: Dg Chest 2 View  08/26/2013   CLINICAL DATA:  Hypotension.  History of breast cancer and jaundice  EXAM: CHEST  2 VIEW  COMPARISON:  04/18/2013  FINDINGS: Patchy lung opacities on the right. Lung volumes are low with basilar atelectatic change. Normal heart size. No acute osseous findings. Status post left mastectomy and axillary dissection.  IMPRESSION: New right-sided airspace disease, primarily concerning for pneumonia.  Recommend followup to clearing.   Electronically Signed   By: Tiburcio Pea M.D.   On: 08/26/2013 23:36   Ct Abdomen Pelvis W Contrast  08/26/2013   CLINICAL DATA:  New onset jaundice, history cirrhosis, hepatocellular carcinoma, chronic hepatitis-C, breast cancer  EXAM: CT ABDOMEN AND PELVIS WITH CONTRAST  TECHNIQUE: Multidetector CT imaging of the abdomen and pelvis was performed using the standard protocol following bolus administration of intravenous contrast. Sagittal and coronal MPR images reconstructed from axial data set.  CONTRAST:  50mL OMNIPAQUE IOHEXOL 300 MG/ML SOLN orally, OMNIPAQUE IOHEXOL 300 MG/ML SOLN IV  COMPARISON:  04/23/2013  FINDINGS: Bibasilar atelectasis.  Innumerable tiny low-attenuation foci identified throughtout a cirrhotic liver, new since previous study, question related to cirrhosis or hepatocellular carcinoma though peliosis hepatis can cause a similar appearance and has been described in patients with hepatocellular carcinoma.  Splenic enlargement, 7.5 x 10.8 x 14.9 cm.  Significant ascites.  No focal abnormalities of the spleen, pancreas, kidneys, or adrenal glands.  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  Enlarged periportal lymph node 2.6 x 2.3 cm, previously 2.2 x 1.9 cm.  Additional enlarged periportal  lymph node is indistinguishable from the adjacent caudate lobe on today's exam, approximately 4.5 x 3.2 cm, previously 4.2 x 3.2 cm.  Perigastric and periesophageal varices/collaterals.  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  Significant wall thickening of the ascending colon and hepatic flexure compatible with colitis.  Stomach and remaining bowel loops grossly unremarkable.  Unremarkable bladder, uterus, and adnexae.  Scattered mesenteric edema.  No hernia or acute osseous findings.  IMPRESSION: Cirrhotic liver with splenomegaly, varices/collaterals and new ascites.  Interim innumerable tiny new low-attenuation  foci throughout the liver, could be related to cirrhosis, hepatocellular carcinoma or peliosis hepatis.  Consider MR imaging with and without contrast for further evaluation.  Slight interval increase in size of periportal adenopathy.  Wall thickening of the hepatic flexure and ascending colon consistent with colitis; differential diagnosis includes infection, inflammatory bowel disease and ischemia.   Electronically Signed   By: Ulyses Southward M.D.   On: 08/26/2013 21:27   US Paracentesis  08/28/2013   CLINICAL DATA:  Hepatitis-C, metastatic hepatocellular carcinoma, breast carcinoma, ascites. Request is made for diagnostic and therapeutic paracentesis  EXAM: ULTRASOUND GUIDED DIAGNOSTIC AND THERAPEUTIC PARACENTESIS  COMPARISON:  CT ABDOMEN PELVIS ON 08/26/2013.  PROCEDURE: An ultrasound guided paracentesis was thoroughly discussed with the patient and questions answered. The benefits, risks, alternatives and complications were also discussed. The patient understands and wishes to proceed with the procedure. Written consent was obtained.  Ultrasound was performed to localize and mark an adequate pocket of fluid in the right lower. quadrant of the abdomen. The area was then prepped and draped in the normal sterile fashion. 1% Lidocaine was used for local anesthesia. Under ultrasound guidance a 19 gauge Yueh catheter was introduced. Paracentesis was performed. The catheter was removed and a dressing applied.  COMPLICATIONS: None immediate  FINDINGS: A total of approximately 2.5 liters. of turbid, amber. fluid was removed. A fluid sample was sent for laboratory analysis.  IMPRESSION: Successful ultrasound guided diagnostic and therapeutic paracentesis yielding 2.5 liters of ascites.  Read by: Jeananne Rama ,P.A.-C.   Electronically Signed   By: Richarda Overlie M.D.   On: 08/28/2013 14:21    Microbiology: Recent Results (from the past 240 hour(s))  URINE CULTURE     Status: None   Collection Time    08/26/13  9:05 PM       Result Value Range Status   Specimen Description URINE, CATHETERIZED   Final   Special Requests NONE   Final   Culture  Setup Time     Final   Value: 08/27/2013 02:36     Performed at [facility] Count     Final   Value: >=100,000 COLONIES/ML     Performed at [facility]   Culture     Final   Value: ESCHERICHIA COLI     Performed at [facility]   Report Status 08/28/2013 FINAL   Final   Organism ID, Bacteria ESCHERICHIA COLI   Final  CULTURE, BLOOD (ROUTINE X 2)     Status: None   Collection Time    08/26/13 11:00 PM      Result Value Range Status   Specimen Description BLOOD RIGHT HAND   Final   Special Requests BOTTLES DRAWN AEROBIC AND ANAEROBIC    Final   Culture  Setup Time     Final   Value: 08/27/2013 01:47     Performed at [facility]  Final   Value:        BLOOD CULTURE RECEIVED NO GROWTH TO DATE CULTURE WILL BE HELD FOR 5 DAYS BEFORE ISSUING A FINAL NEGATIVE REPORT     Performed at [facility]   Report Status PENDING   Incomplete  CULTURE, BLOOD (ROUTINE X 2)     Status: None   Collection Time    08/26/13 11:08 PM      Result Value Range Status   Specimen Description BLOOD RIGHT HAND   Final   Special Requests BOTTLES DRAWN AEROBIC AND ANAEROBIC    Final   Culture  Setup Time     Final   Value: 08/27/2013 01:47     Performed at [facility]   Culture     Final   Value:        BLOOD CULTURE RECEIVED NO GROWTH TO DATE CULTURE WILL BE HELD FOR 5 DAYS BEFORE ISSUING A FINAL NEGATIVE REPORT     Performed at [facility]   Report Status PENDING   Incomplete  MRSA PCR SCREENING     Status: None   Collection Time    08/27/13 12:04 AM      Result Value Range Status   MRSA by PCR NEGATIVE  NEGATIVE Final   Comment:            The GeneXpert MRSA Assay (FDA     approved for NASAL specimens     only), is one component of a     comprehensive MRSA colonization     surveillance  program. It is not     intended to diagnose MRSA     infection nor to guide or     monitor treatment for     MRSA infections.  URINE CULTURE     Status: None   Collection Time    08/27/13  3:07 AM      Result Value Range Status   Specimen Description URINE, CLEAN CATCH   Final   Special Requests NONE   Final   Culture  Setup Time     Final   Value: 08/27/2013 11:27     Performed at [facility] Count     Final   Value: NO GROWTH     Performed at [facility]   Culture     Final   Value: NO GROWTH     Performed at [facility]   Report Status 08/28/2013 FINAL   Final  BODY FLUID CULTURE     Status: None   Collection Time    08/28/13  2:18 PM      Result Value Range Status   Specimen Description ASCITIC   Final   Special Requests Immunocompromised   Final   Gram Stain     Final   Value: RARE WBC PRESENT, PREDOMINANTLY PMN     NO ORGANISMS SEEN     Performed at [facility]   Culture     Final   Value: NO GROWTH     Performed at [facility]   Report Status PENDING   Incomplete     Labs: Basic Metabolic Panel:  Recent Labs Lab 08/26/13 1955 08/26/13 2006 08/27/13 0600 08/29/13 0525 08/29/13 1030  NA 123* 129* 122* 124* 126*  K 3.2* 3.3* 4.3 6.0* 6.7*  CL 86* 88* 92* 98 100  CO2 24  --  21 17* 18*  GLUCOSE 108* 104* 97 100* 98  BUN 12 11 11 17 19   CREATININE 0.92 1.20* 0.89 0.87 0.93  CALCIUM 8.2*  --  7.2* 7.9* 8.1*   Liver Function Tests:  Recent Labs Lab 08/26/13 1955 08/27/13 0600 08/28/13 1204 08/29/13 0525  AST 127* 101*  --  91*  ALT 43* 35  --  34  ALKPHOS 132* 111  --  115  BILITOT 15.2* 14.0* 17.2* 18.4*  PROT 7.6 6.5  --  6.9  ALBUMIN 2.4* 2.0*  --  2.0*    Recent Labs Lab 08/26/13 1955  LIPASE 13    Recent Labs Lab 08/29/13 0525  AMMONIA RESULTS UNAVAILABLE DUE TO INTERFERING SUBSTANCE   CBC:  Recent Labs Lab 08/26/13 1955 08/26/13 2006 08/27/13 0049 08/27/13 0600  08/27/13 1225 08/29/13 0525  WBC 6.9  --  5.1 4.9 6.5 10.5  HGB 8.8* 9.9* 7.7* 7.7* 8.0* 8.1*  HCT 24.7* 29.0* 21.7* 21.8* 22.6* 24.4*  MCV 101.6*  --  101.9* 101.9* 103.2* 106.1*  PLT PLATELET CLUMPS NOTED ON SMEAR  --  38* 39* 48* 62*   Cardiac Enzymes: No results found for this basename: CKTOTAL, CKMB, CKMBINDEX, TROPONINI,  in the last 168 hours BNP: BNP (last 3 results) No results found for this basename: PROBNP,  in the last 8760 hours CBG: No results found for this basename: GLUCAP,  in the last 168 hours     Signed:  Alejandra Barna  [facility] 08/30/2013, 11:49 AM

## 2013-08-30 NOTE — Consult Note (Signed)
[facility] Liaison: [employer] available for Ms. Kunzman today. Sister aware and agreeable to complete paperwork. Will need dc sum faxed to [phone] and RN to call report to [phone]. Please arrange transport for patient to arrive by noon if possible. Dr. Cyndie Chime to assume care with symptom management by Dr. Barbee Shropshire. Thank you. Forrestine Him LCSW [phone]

## 2013-08-30 NOTE — Progress Notes (Signed)
Clinical Social Work  CSW faxed DC summary to [facility] [facility] and RN has called report to facility. Patient and family aware of DC. CSW prepared DC packet with DNR and [facility] forms included. CSW spoke with [facility] who will transport patient as soon as possible. CSW is signing off but available if further needs arise.  [place], [place] [phone]

## 2013-08-30 NOTE — Care Management Note (Signed)
    Page 1 of 2   08/30/2013     1:42:14 PM   CARE MANAGEMENT NOTE 08/30/2013  Patient:  Jamie Diaz, Jamie Diaz   Account Number:  [id number]  Date Initiated:  08/27/2013  Documentation initiated by:  DAVIS,RHONDA  Subjective/Objective Assessment:   hypotension, hepatic ca, poss pna     Action/Plan:   home when stable   Anticipated DC Date:  08/30/2013   Anticipated DC Plan:  HOME/SELF CARE  In-house referral  Clinical Social Worker      DC Planning Services  NA      [facility] Choice  NA   Choice offered to / List presented to:  NA   DME arranged  NA      DME agency  NA     HH arranged  NA      HH agency  NA   Status of service:  Completed, signed off Medicare Important Message given?  NA - LOS <3 / Initial given by admissions (If response is "NO", the following Medicare IM given date fields will be blank) Date Medicare IM given:   Date Additional Medicare IM given:    Discharge Disposition:  HOSPICE MEDICAL FACILITY  Per UR Regulation:  Reviewed for med. necessity/level of care/duration of stay  If discussed at Long Length of Stay Meetings, dates discussed:    Comments:  08/30/2013 [facility] Jaclin Finks BSN RN CCM [phone] PLANS ARE FOR PATIENT TO DISCHARGET[facility] TODAY.   [id number] Earlene Plater, RN, BSN, [place] [phone] Chart Reviewed for discharge and hospital needs. Discharge needs at time of review:  None present will follow for needs. Review of patient progress due on [id number].

## 2013-08-30 NOTE — Procedures (Signed)
US guided therapeutic paracentesis performed yielding 1.6 liters golden yellow fluid. No immediate complications.

## 2013-08-31 LAB — BODY FLUID CULTURE: Culture: NO GROWTH

## 2013-09-01 ENCOUNTER — Encounter: Payer: Self-pay | Admitting: Radiation Oncology

## 2013-09-01 NOTE — Progress Notes (Signed)
  Radiation Oncology         [phone] ________________________________  Name: MERCIE BALSLEY MRN: [id number]  Date: 09/01/2013  DOB: [birth year]  End of Treatment Note  Diagnosis:   Hepatocellular carcinoma with significant peri-pancreatic and porta hepatis adenopathy     Indication for treatment:  Local regional control and poor tolerance to Nexavar  Radiation treatment dates:   07/22/2013 through 08/26/2013  Site/dose:   Porta hepatis area 50 gray in 25 fractions  Beams/energy:   I MRT-VMAT, 6 MV photons  Narrative: The patient tolerated radiation treatment relatively well.   She did have some nausea during the course of treatments as well as fatigue.  She had intermittent constipation alternating with diarrhea.  During the weekend prior to the patient's last treatment she developed significant nausea and poor by mouth intake.  She was noted to be dehydrated with orthostatic changes as well as jaundice appearance and was subsequently admitted that night. Patient was felt to have possible pneumonia as well as a UTI.  CT scan of abdomen revealed probable progressive liver involvement. Patient's clinical situation did not improve despite aggressive management.   Plan: Patient will be discharged to [facility].  -----------------------------------  Billie Lade, PhD, MD

## 2013-09-02 LAB — CULTURE, BLOOD (ROUTINE X 2): Culture: NO GROWTH

## 2013-09-12 DEATH — deceased

## 2013-09-17 ENCOUNTER — Ambulatory Visit (HOSPITAL_COMMUNITY): Payer: Medicare Other

## 2013-09-20 ENCOUNTER — Other Ambulatory Visit: Payer: Medicare Other

## 2013-09-20 ENCOUNTER — Ambulatory Visit: Payer: Medicare Other | Admitting: Nurse Practitioner

## 2013-09-23 ENCOUNTER — Telehealth: Payer: Self-pay | Admitting: Dietician

## 2013-10-18 ENCOUNTER — Ambulatory Visit: Payer: Medicare Other | Admitting: Oncology

## 2013-10-18 ENCOUNTER — Other Ambulatory Visit: Payer: Medicare Other

## 2014-07-12 IMAGING — CT CT ABD-PELV W/ CM
1 of 3 series · 13 of 32 positions shown, 18 images · IV contrast (OMNIPAQUE 300)
Comparison: 04/23/2013

CLINICAL DATA: New onset jaundice, history cirrhosis,
hepatocellular carcinoma, chronic hepatitis-C, breast cancer

EXAM:
CT ABDOMEN AND PELVIS WITH CONTRAST
TECHNIQUE: Multidetector CT imaging of the abdomen and pelvis was performed
using the standard protocol following bolus administration of
intravenous contrast. Sagittal and coronal MPR images reconstructed
from axial data set.
CONTRAST:  50mL OMNIPAQUE IOHEXOL 300 MG/ML SOLN orally, 100mL
OMNIPAQUE IOHEXOL 300 MG/ML SOLN IV

[Series 2: abd/pel with · axial · 0.79mm/px · z∈[-375,+30]mm · 13 of 91 slices shown, 18 images]
[im 5/91  soft-tissue]
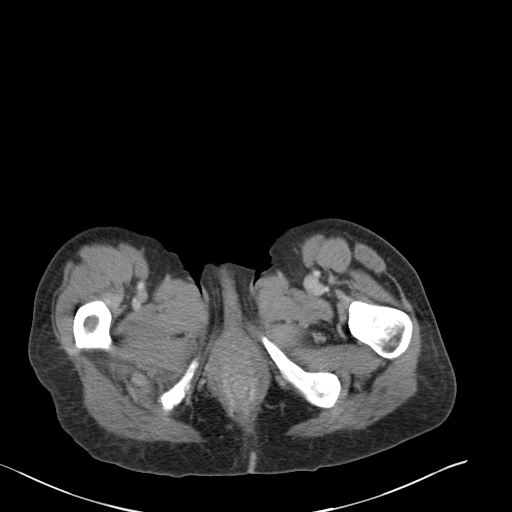
[im 5/91  bone]
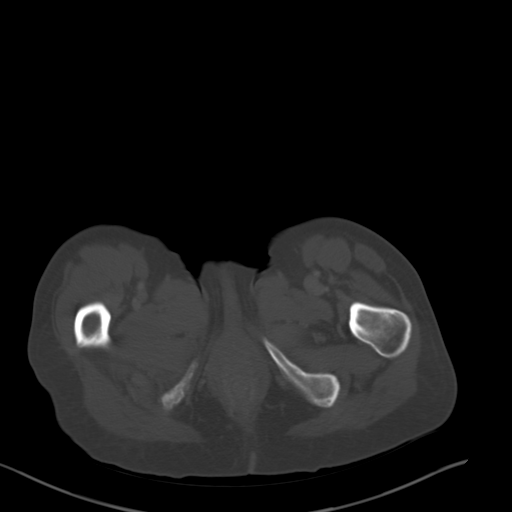
[im 15/91  soft-tissue]
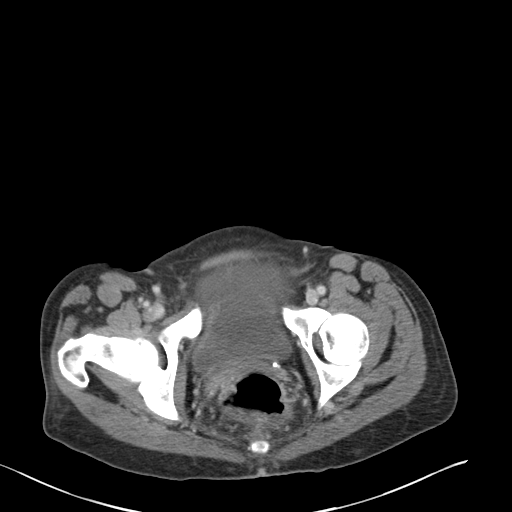
[im 19/91  soft-tissue]
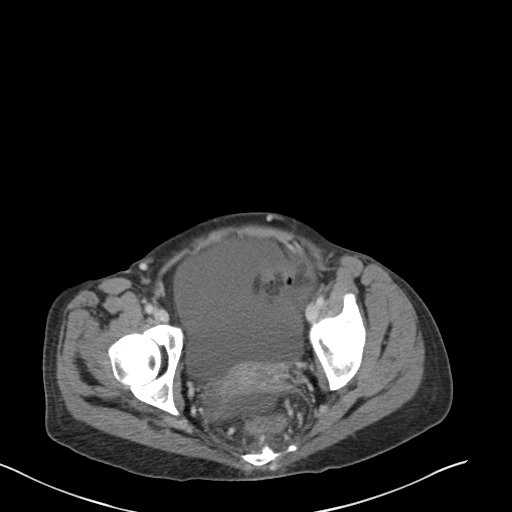
[im 29/91  soft-tissue]
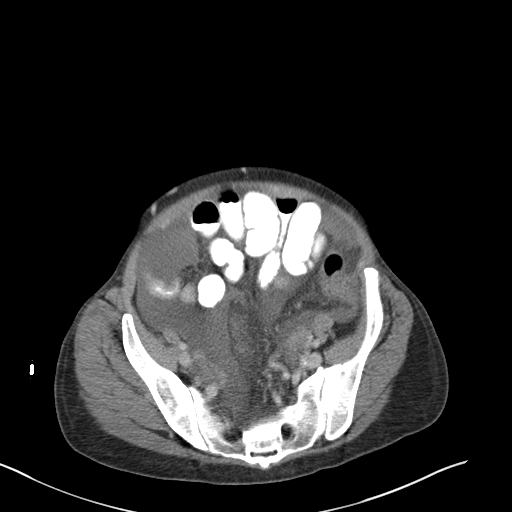
[im 34/91  soft-tissue]
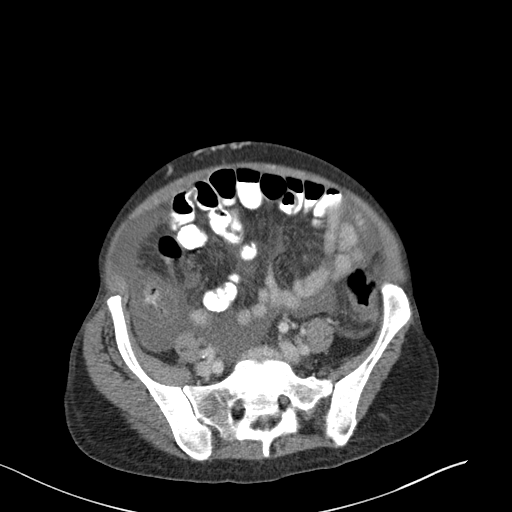
[im 43/91  soft-tissue]
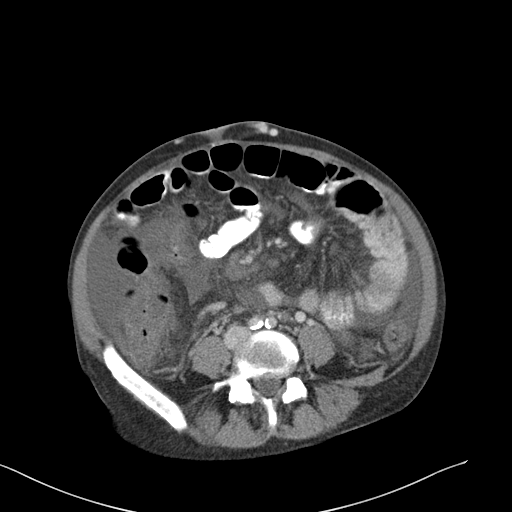
[im 48/91  soft-tissue]
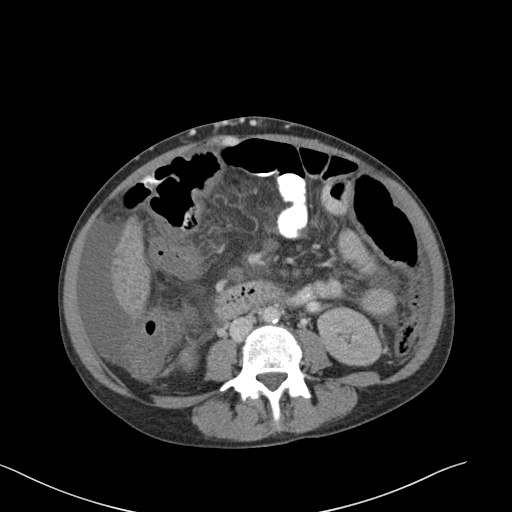
[im 57/91  soft-tissue]
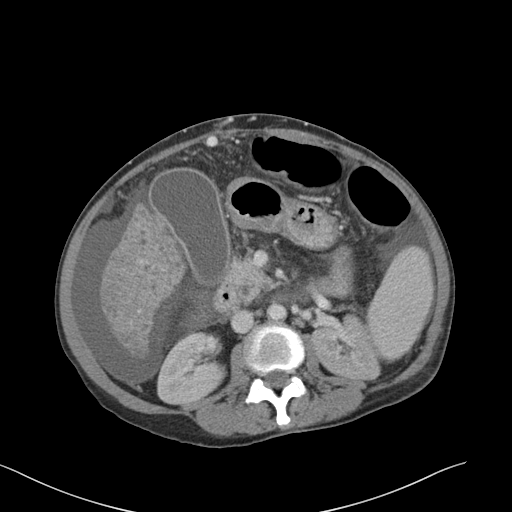
[im 62/91  soft-tissue]
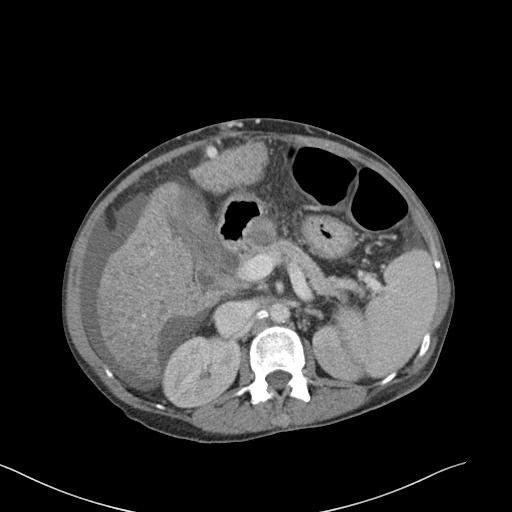
[im 62/91  bone]
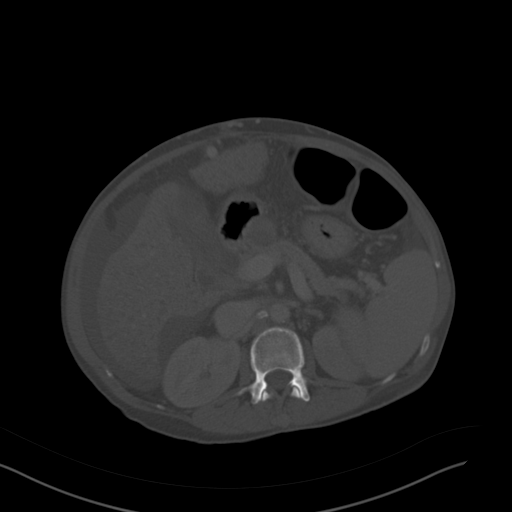
[im 72/91  soft-tissue]
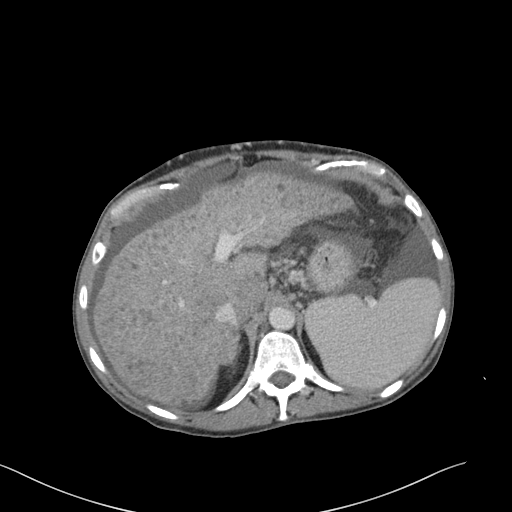
[im 72/91  lung]
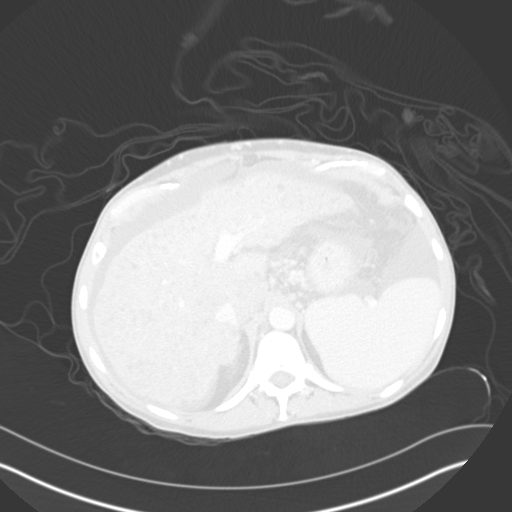
[im 76/91  soft-tissue]
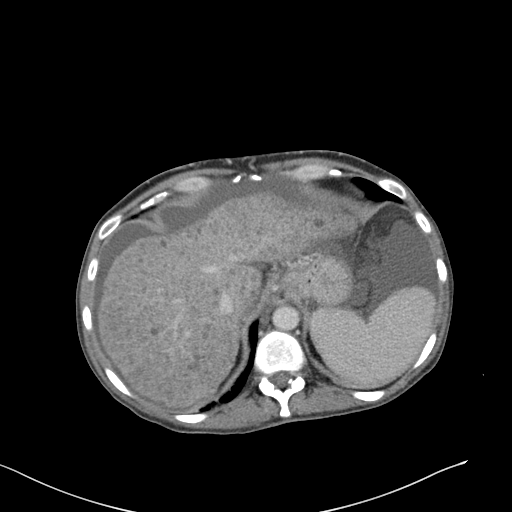
[im 76/91  lung]
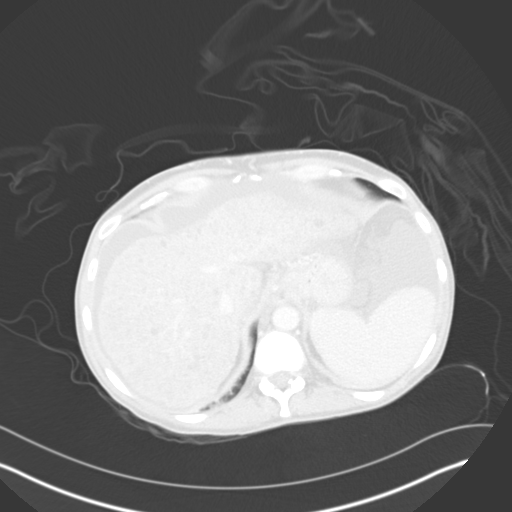
[im 81/91  lung]
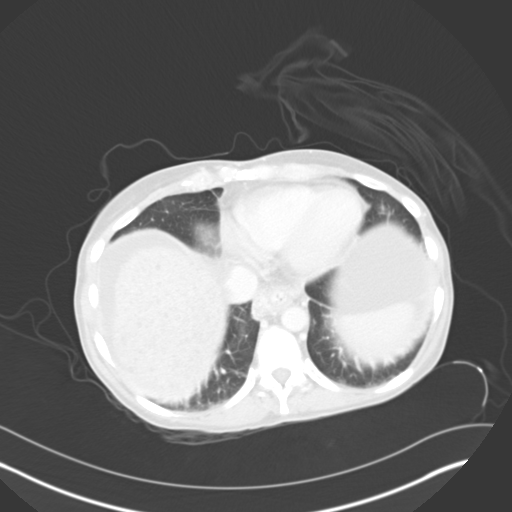
[im 86/91  soft-tissue]
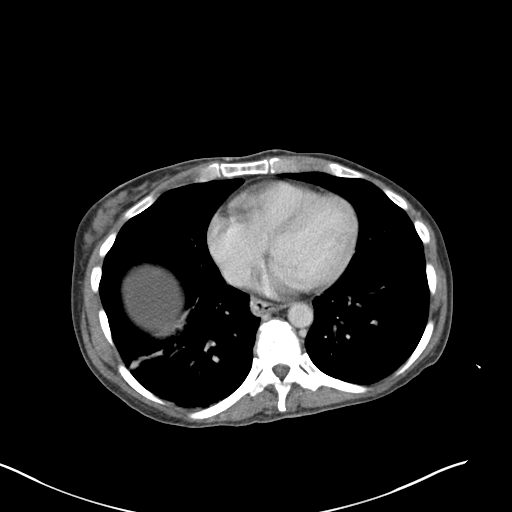
[im 86/91  lung]
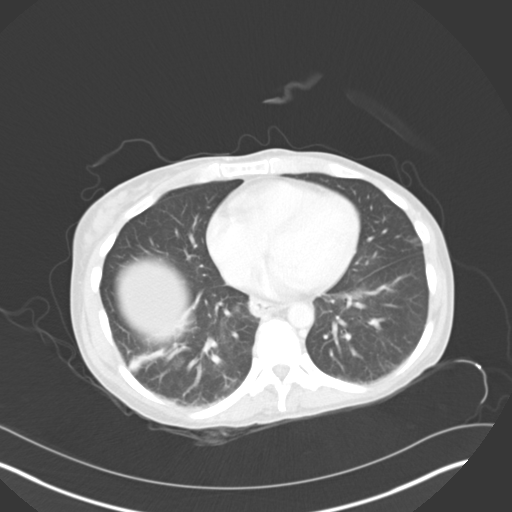

[13 of 32 positions shown; findings below may reference images not displayed]

FINDINGS: Bibasilar atelectasis.

Innumerable tiny low-attenuation foci identified throughtout a
cirrhotic liver, new since previous study, question related to
cirrhosis or hepatocellular carcinoma though peliosis hepatis can
cause a similar appearance and has been described in patients with
hepatocellular carcinoma.

Splenic enlargement, 7.5 x 10.8 x 14.9 cm.

Significant ascites.

No focal abnormalities of the spleen, pancreas, kidneys, or adrenal
glands.

Small calcified splenic artery aneurysm 13 mm diameter image 25.

Enlarged periportal lymph node 2.6 x 2.3 cm, previously 2.2 x
cm.

Additional enlarged periportal lymph node is indistinguishable from
the adjacent caudate lobe on today's exam, approximately 4.5 x
cm, previously 4.2 x 3.2 cm.

Perigastric and periesophageal varices/collaterals.

Subcutaneous varices with recannulization of the umbilical vein.

Significant wall thickening of the ascending colon and hepatic
flexure compatible with colitis.

Stomach and remaining bowel loops grossly unremarkable.

Unremarkable bladder, uterus, and adnexae.

Scattered mesenteric edema.

No hernia or acute osseous findings.
IMPRESSION: Cirrhotic liver with splenomegaly, varices/collaterals and new
ascites.

Interim innumerable tiny new low-attenuation foci throughout the
liver, could be related to cirrhosis, hepatocellular carcinoma or
peliosis hepatis.

Consider MR imaging with and without contrast for further
evaluation.

Slight interval increase in size of periportal adenopathy.

Wall thickening of the hepatic flexure and ascending colon
consistent with colitis; differential diagnosis includes infection,
inflammatory bowel disease and ischemia.

## 2014-07-14 IMAGING — US US PARACENTESIS
1 series · 5 of 5 positions shown · non-contrast
Comparison: CT ABDOMEN PELVIS ON 08/26/2013.

CLINICAL DATA: Hepatitis-C, metastatic hepatocellular carcinoma,
breast carcinoma, ascites. Request is made for diagnostic and
therapeutic paracentesis

EXAM:
ULTRASOUND GUIDED DIAGNOSTIC AND THERAPEUTIC PARACENTESIS

[Series 1: us paracentesis · 0.27mm/px · 5 of 5 slices shown]
[im 1/5]
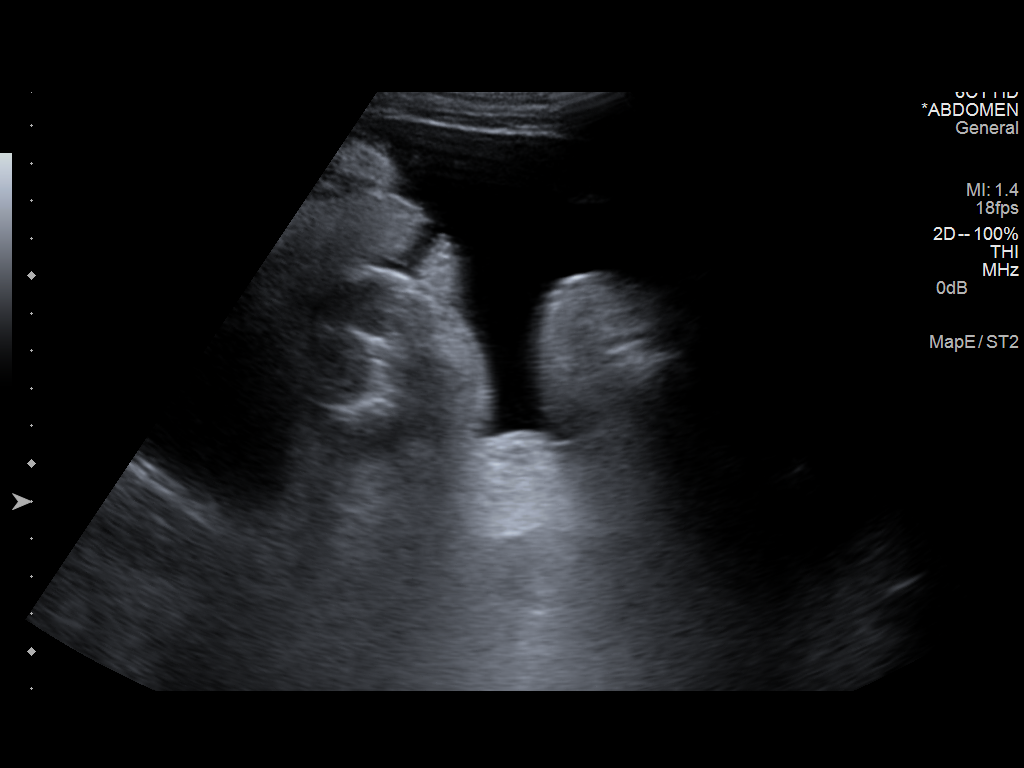
[im 2/5]
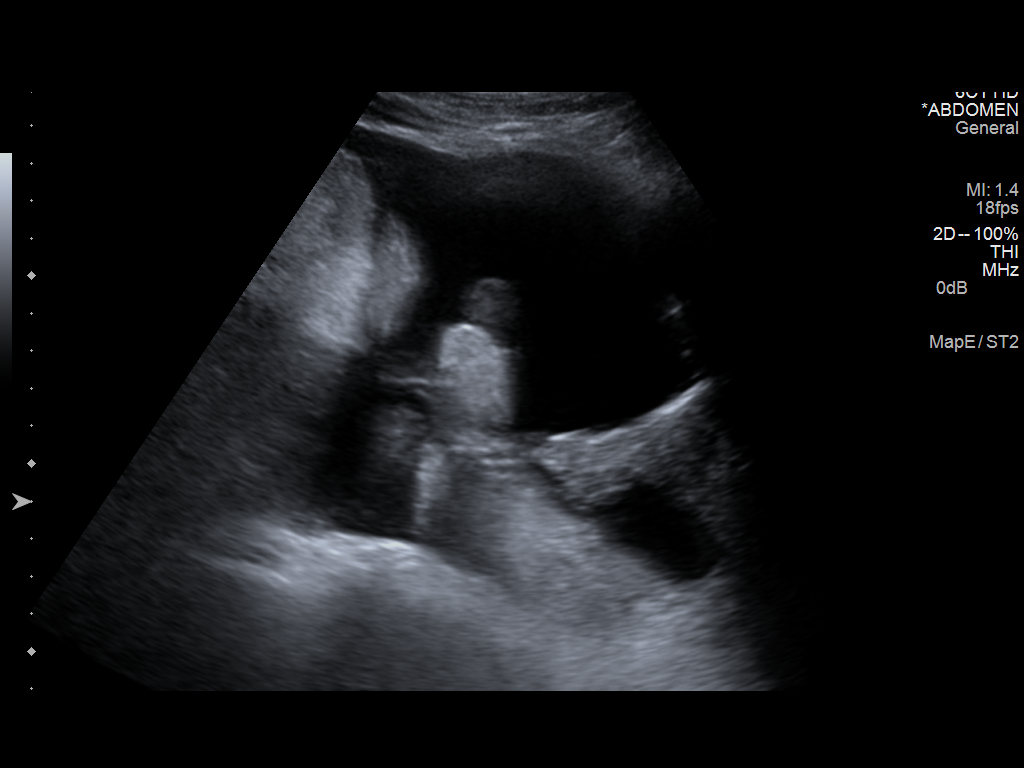
[im 3/5]
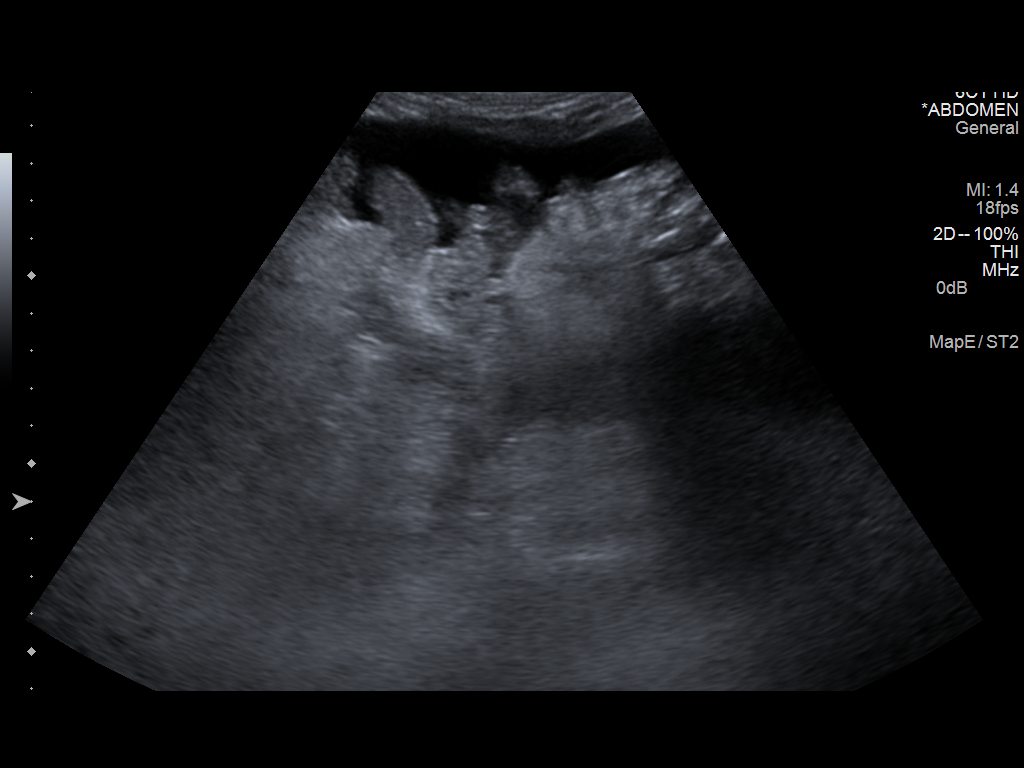
[im 4/5]
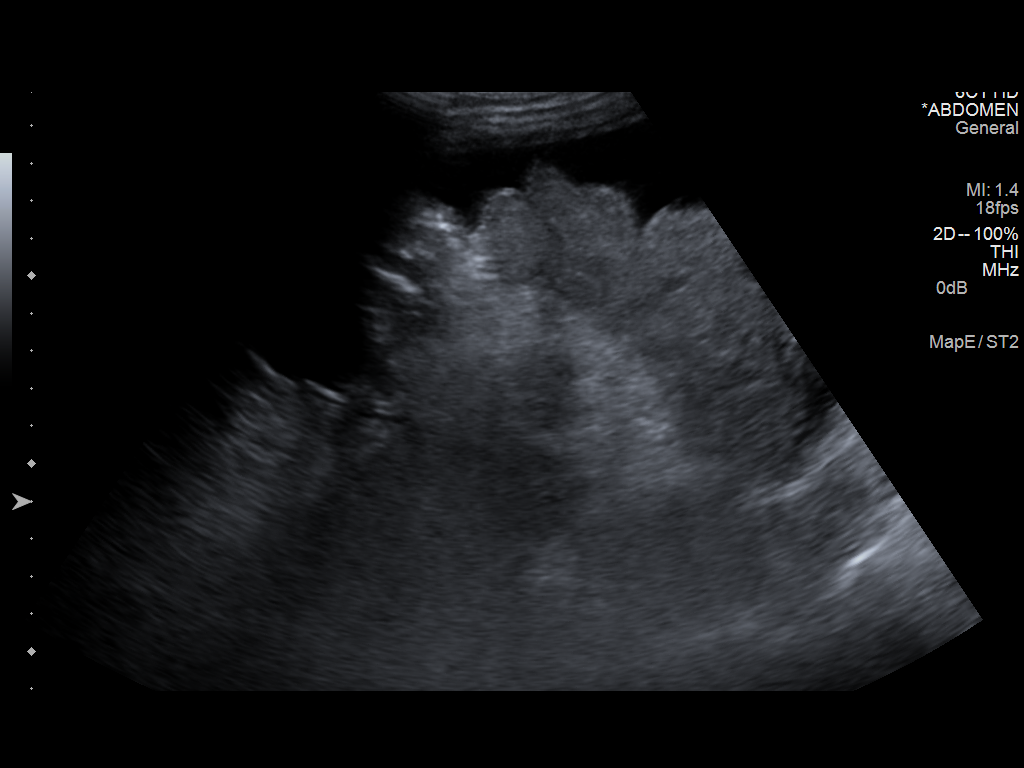
[im 5/5]
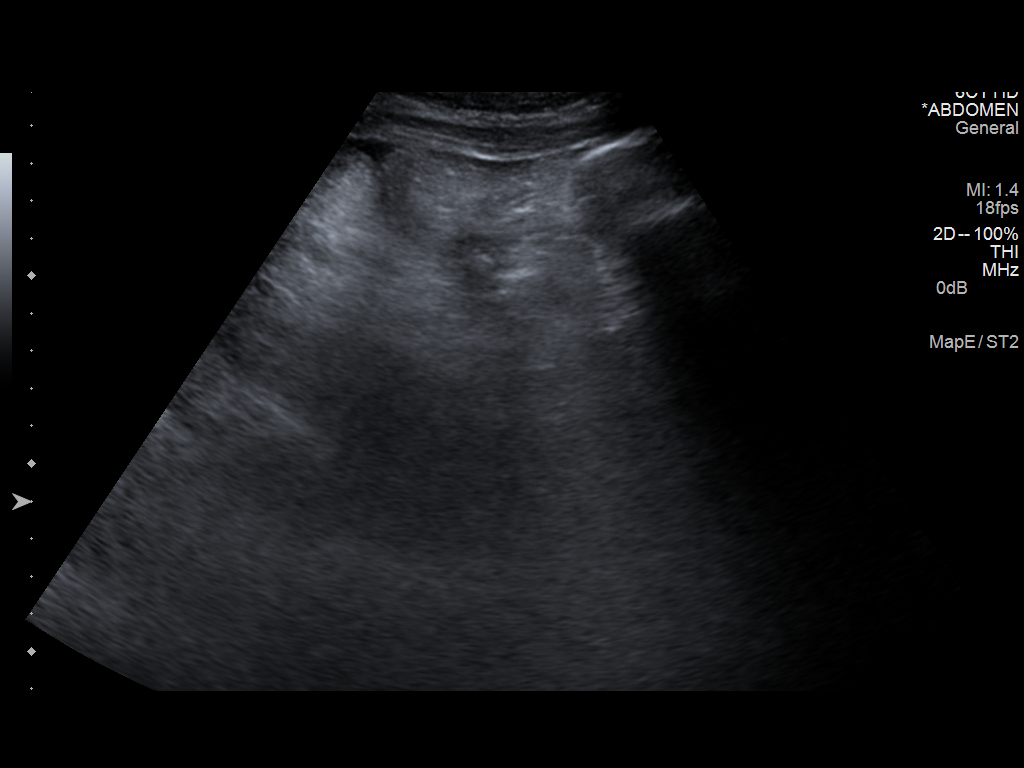

[5 of 5 positions shown; findings below may reference images not displayed]

PROCEDURE:
An ultrasound guided paracentesis was thoroughly discussed with the
patient and questions answered. The benefits, risks, alternatives
and complications were also discussed. The patient understands and
wishes to proceed with the procedure. Written consent was obtained.

Ultrasound was performed to localize and mark an adequate pocket of
fluid in the right lower... quadrant of the abdomen. The area was
then prepped and draped in the normal sterile fashion. 1% Lidocaine
was used for local anesthesia. Under ultrasound guidance a 19 gauge
Yueh catheter was introduced. Paracentesis was performed. The
catheter was removed and a dressing applied.

COMPLICATIONS:
None immediate
FINDINGS: A total of approximately 2.5 liters... of turbid, amber... fluid was
removed. A fluid sample was sent for laboratory analysis.
IMPRESSION: Successful ultrasound guided diagnostic and therapeutic paracentesis
yielding 2.5 liters of ascites.
